# Patient Record
Sex: Female | Born: 1975 | Race: White | Hispanic: No | Marital: Single | Smoking: Former smoker
Health system: Southern US, Community
[De-identification: ages and names within clinical notes are randomized; demographics above are authoritative.]

## PROBLEM LIST (undated history)

## (undated) DIAGNOSIS — R51 Headache: Secondary | ICD-10-CM

## (undated) DIAGNOSIS — E039 Hypothyroidism, unspecified: Secondary | ICD-10-CM

## (undated) DIAGNOSIS — F329 Major depressive disorder, single episode, unspecified: Secondary | ICD-10-CM

## (undated) DIAGNOSIS — F32A Depression, unspecified: Secondary | ICD-10-CM

## (undated) DIAGNOSIS — R519 Headache, unspecified: Secondary | ICD-10-CM

## (undated) DIAGNOSIS — F419 Anxiety disorder, unspecified: Secondary | ICD-10-CM

## (undated) HISTORY — PX: TOOTH EXTRACTION: SUR596

---

## 2015-08-17 ENCOUNTER — Other Ambulatory Visit (HOSPITAL_COMMUNITY)
Admission: RE | Admit: 2015-08-17 | Discharge: 2015-08-17 | Disposition: A | Discharge status: Home / Self Care | Payer: BLUE CROSS/BLUE SHIELD | Source: Ambulatory Visit | Attending: Family Medicine | Admitting: Family Medicine

## 2015-08-17 DIAGNOSIS — Z1151 Encounter for screening for human papillomavirus (HPV): Secondary | ICD-10-CM | POA: Diagnosis present

## 2015-08-17 DIAGNOSIS — Z01419 Encounter for gynecological examination (general) (routine) without abnormal findings: Secondary | ICD-10-CM | POA: Diagnosis present

## 2016-05-17 ENCOUNTER — Other Ambulatory Visit (HOSPITAL_COMMUNITY): Payer: Self-pay | Admitting: General Surgery

## 2016-05-26 ENCOUNTER — Ambulatory Visit (HOSPITAL_COMMUNITY): Payer: BLUE CROSS/BLUE SHIELD

## 2016-05-30 ENCOUNTER — Encounter: Payer: BLUE CROSS/BLUE SHIELD | Attending: General Surgery | Admitting: Dietician

## 2016-05-30 DIAGNOSIS — Z6841 Body Mass Index (BMI) 40.0 and over, adult: Secondary | ICD-10-CM | POA: Insufficient documentation

## 2016-05-30 NOTE — Patient Instructions (Signed)
Follow Pre-Op Goals Try Protein Shakes Call [facility] at [phone] when surgery is scheduled to enroll in Pre-Op Class  Things to remember:  Please always be honest with us. We want to support you!  If you have any questions or concerns in between appointments, please call or email Liz, Leslie, or Laurie.  The diet after surgery will be high protein and low in carbohydrate.  Vitamins and calcium need to be taken for the rest of your life.  Feel free to include support people in any classes or appointments.   Supplement recommendations:  Before Surgery   1 Complete Multivitamin with Iron  3000 IU Vitamin D3  After Surgery   2 Chewable Multivitamins  **Best Choice - Bariatric Advantage Advanced Multi EA      3 Chewable Calcium (500 mg each, total 1200-1500 mg per day)  **Best Choice - Celebrate, Bariatric Advantage, or Wellesse  Other Options:    2 Flinstones Complete + up to 100 mg Thiamin + 2000-3000 IU Vitamin D3 + 350-500 mcg Vitamin B12 + 30-45 mg Iron (with history of deficiency)  2 Celebrate MultiComplete with 18 mg Iron (this provides 6000 IU of  Vitamin D3)  4 Celebrate Essential Multi 2 in 1 (has calcium) + 18-60 mg separate  iron  Vitamins and Calcium are available at:   [facility]   [address], [place], [place] [address]   [email or web address]  [email or web address]  [email or web address]   

## 2016-05-30 NOTE — Progress Notes (Signed)
  Pre-Op Assessment Visit:  Pre-Operative Sleeve Gastrectomy Surgery  Medical Nutrition Therapy:  Appt start time: 0730   End time:  0820.  Patient was seen on 05/30/2016 for Pre-Operative Nutrition Assessment. Assessment [facility] on 05/30/2016.   Preferred Learning Style:   No preference indicated   Learning Readiness:   Ready  Handouts given during visit include:  Pre-Op Goals Bariatric Surgery Protein Shakes   During the appointment today the following Pre-Op Goals were reviewed with the patient: Maintain or lose weight as instructed by your surgeon Make healthy food choices Begin to limit portion sizes Limited concentrated sugars and fried foods Keep fat/sugar in the single digits per serving on   food labels Practice CHEWING your food  (aim for 30 chews per bite or until applesauce consistency) Practice not drinking 15 minutes before, during, and 30 minutes after each meal/snack Avoid all carbonated beverages  Avoid/limit caffeinated beverages  Avoid all sugar-sweetened beverages Consume 3 meals per day; eat every 3-5 hours Make a list of non-food related activities Aim for 64-100 ounces of FLUID daily  Aim for at least 60-80 grams of PROTEIN daily Look for a liquid protein source that contain ?15 g protein and ?5 g carbohydrate  (ex: shakes, drinks, shots)  Patient-Centered Goals: Would like to be able to exercise, feel better about herself, change eating habits 10 confidence/10 importance scale 1-10  Demonstrated degree of understanding via:  Teach Back  Teaching Method Utilized:  Visual Auditory Hands on  Barriers to learning/adherence to lifestyle change: none  [facility] to [facility] the [facility] to enroll in Pre-Op and Post-Op Nutrition Education when surgery date is scheduled.

## 2016-06-28 ENCOUNTER — Encounter: Payer: BLUE CROSS/BLUE SHIELD | Attending: General Surgery | Admitting: Dietician

## 2016-06-28 DIAGNOSIS — Z6841 Body Mass Index (BMI) 40.0 and over, adult: Secondary | ICD-10-CM | POA: Diagnosis present

## 2016-06-28 NOTE — Progress Notes (Signed)
  6 Months Supervised Weight Loss Visit:   Pre-Operative sleeve gastrectomy Surgery  Medical Nutrition Therapy:  Appt start time: 0730 end time:  0745.  Primary concerns today: Supervised Weight Loss Visit. Returns with no weight change since last month. Will be having a 4 week break from school next month where she will be working on doing more research for cooking after surgery. Trying to make more foods at home. Interested in plant based protein. Overall hasn't made a lot of change.  Having one 1 cup of regular coffee and then decaf. Drinking a lot of water meals. Tried to not drink while eating and chewing well. Working on Production designer, theatre/television/filmemotional eating.   Trying to do some walking 2 x day at work for 20-30 minutes.  Weight: 26.4 BMI:40.7  Preferred Learning Style:   No preference indicated   Learning Readiness:   Ready   Progress Towards Goal(s):  In progress.  Handouts given during visit include:  Vegetarian Protein   Nutritional Diagnosis:  [place]-3.3 Obesity related to past poor dietary habits and physical inactivity as evidenced by patient attending supervised weight loss for insurance approval of bariatric surgery.    Intervention:  Nutrition counseling provided. Plan: Continue to work on finding healthier recipes. Continue working on chewing well and not drinking during meals. Continue to work on walking most days.   Teaching Method Utilized:  Visual Auditory Hands on  Barriers to learning/adherence to lifestyle change: none  Demonstrated degree of understanding via:  Teach Back   Monitoring/Evaluation:  Dietary intake, exercise, and body weight. Follow up in 1 months for 6 month supervised weight loss visit.

## 2016-06-28 NOTE — Patient Instructions (Addendum)
Continue to work on finding healthier recipes. Continue working on chewing well and not drinking during meals. Continue to work on walking most days.

## 2016-07-26 ENCOUNTER — Encounter: Payer: BLUE CROSS/BLUE SHIELD | Attending: General Surgery | Admitting: Dietician

## 2016-07-26 DIAGNOSIS — Z6841 Body Mass Index (BMI) 40.0 and over, adult: Secondary | ICD-10-CM | POA: Diagnosis present

## 2016-07-26 NOTE — Patient Instructions (Addendum)
Continue to work on finding healthier recipes. Continue working on chewing well and not drinking during meals. Continue to work on walking most days.  Steam tempeh first before cooking. Try marinating it.

## 2016-07-26 NOTE — Progress Notes (Signed)
  6 Months Supervised Weight Loss Visit:   Pre-Operative sleeve gastrectomy Surgery  Medical Nutrition Therapy:  Appt start time: 0735 end time:  0750.  Primary concerns today: Supervised Weight Loss Visit. Returns with a 3 lb weight gain since last month. Having a lot of stress with changes at work. Trying to not eat due to stress. Trying to cook more. On a break from school. Having 1 cup of caffeine per day.   Having protein smoothie for breakfast. Looking for recipes.   Trying to do some walking 1-2 x day at work and a longer walk on weekend.   Weight: 266.5 BMI:41.1  Preferred Learning Style:   No preference indicated   Learning Readiness:   Ready   Progress Towards Goal(s):  In progress.  Handouts given during visit include:  Vegetarian Protein   Nutritional Diagnosis:  [place]-3.3 Obesity related to past poor dietary habits and physical inactivity as evidenced by patient attending supervised weight loss for insurance approval of bariatric surgery.    Intervention:  Nutrition counseling provided. Plan: Continue to work on finding healthier recipes. Continue working on chewing well and not drinking during meals. Continue to work on walking most days.  Steam tempeh first before cooking. Try marinating it.   Teaching Method Utilized:  Visual Auditory Hands on  Barriers to learning/adherence to lifestyle change: none  Demonstrated degree of understanding via:  Teach Back   Monitoring/Evaluation:  Dietary intake, exercise, and body weight. Follow up in 1 months for 6 month supervised weight loss visit.

## 2016-08-24 ENCOUNTER — Encounter: Payer: BLUE CROSS/BLUE SHIELD | Attending: General Surgery | Admitting: Skilled Nursing Facility1

## 2016-08-24 ENCOUNTER — Encounter: Payer: Self-pay | Admitting: Skilled Nursing Facility1

## 2016-08-24 DIAGNOSIS — E6609 Other obesity due to excess calories: Secondary | ICD-10-CM

## 2016-08-24 DIAGNOSIS — Z6841 Body Mass Index (BMI) 40.0 and over, adult: Secondary | ICD-10-CM | POA: Diagnosis present

## 2016-08-24 NOTE — Patient Instructions (Addendum)
-  Keep working on chewing 30 times per bite   -Get back into more movement   -Drink fluid throughout the day (aim for 64 ounces)  -Work on not drinking during meals: 15 minutes before and 30 minutes after

## 2016-08-24 NOTE — Progress Notes (Signed)
  6 Months Supervised Weight Loss Visit:   Pre-Operative sleeve gastrectomy Surgery  Medical Nutrition Therapy:  Appt start time: 0735 end time:  0750.  Primary concerns today: Supervised Weight Loss Visit. Returns with a 5 pound wt loss. Pt states she has started taking levothyroxine. Pt states she is working on becoming a vegetarian.  Pt states she started school back.    Wt: 261.6 pounds BMI: 40.37  Having protein smoothie for breakfast. Looking for recipes.   Trying to do some walking 1-2 x day at work and a longer walk on weekend: pt states she hopes to start back up    Preferred Learning Style:   No preference indicated   Learning Readiness:   Ready   Progress Towards Goal(s):  In progress.  Handouts given during visit include:  Vegetarian Protein   Nutritional Diagnosis:  [place]-3.3 Obesity related to past poor dietary habits and physical inactivity as evidenced by patient attending supervised weight loss for insurance approval of bariatric surgery.    Intervention:  Nutrition counseling provided. Plan: -Keep working on chewing 30 times per bite   -Get back into more movement   -Drink fluid throughout the day (aim for 64 ounces)  -Work on not drinking during meals: 15 minutes before and 30 minutes after  Teaching Method Utilized:  Visual Auditory Hands on  Barriers to learning/adherence to lifestyle change: none  Demonstrated degree of understanding via:  Teach Back   Monitoring/Evaluation:  Dietary intake, exercise, and body weight. Follow up in 1 months for 6 month supervised weight loss visit.

## 2016-09-28 ENCOUNTER — Encounter: Payer: Self-pay | Admitting: Skilled Nursing Facility1

## 2016-09-28 ENCOUNTER — Encounter: Payer: BLUE CROSS/BLUE SHIELD | Attending: General Surgery | Admitting: Skilled Nursing Facility1

## 2016-09-28 DIAGNOSIS — E669 Obesity, unspecified: Secondary | ICD-10-CM

## 2016-09-28 DIAGNOSIS — Z6841 Body Mass Index (BMI) 40.0 and over, adult: Secondary | ICD-10-CM | POA: Insufficient documentation

## 2016-09-28 NOTE — Progress Notes (Signed)
  6 Months Supervised Weight Loss Visit:   Pre-Operative sleeve gastrectomy Surgery  Medical Nutrition Therapy:  Appt start time: 0735 end time:  0750.  Primary concerns today: Supervised Weight Loss Visit. Returns with a 1 pound wt loss. Pt states she is struggling with temperature control, blood pressure dropping and light headedness. Pt states she is working on eating better: got a freezer of morning star patty. Pt states she does not like vegetables and she is working on being a vegetarian.. Pt states she struggles with GERD. Pt states  Next month is her last month of SWL. Pt states she is still working on trying to be more active.   Wt: 259.11.2 pounds BMI: 40.07  Diet recall: Premier protein shake Leftovers or can of soup-split soup or alphabet soup with crackers  Cereal or stir fry or fajita and vegetables   Beverages: water, coffee,   Having protein smoothie for breakfast. Looking for recipes.   Trying to do some walking 1-2 x day at work and a longer walk on weekend: pt states she hopes to start back up    Preferred Learning Style:   No preference indicated   Learning Readiness:   Ready   Progress Towards Goal(s):  In progress.  Handouts given during visit include:  Vegetarian Protein   Nutritional Diagnosis:  [place]-3.3 Obesity related to past poor dietary habits and physical inactivity as evidenced by patient attending supervised weight loss for insurance approval of bariatric surgery.    Intervention:  Nutrition counseling provided. Plan: -Keep working on chewing 30 times per bite   -Get back into more movement   -Drink fluid throughout the day (aim for 64 ounces)  -Work on not drinking during meals: 15 minutes before and 30 minutes after  Teaching Method Utilized:  Visual Auditory Hands on  Barriers to learning/adherence to lifestyle change: none  Demonstrated degree of understanding via:  Teach Back   Monitoring/Evaluation:  Dietary intake,  exercise, and body weight. Follow up in 1 months for 6 month supervised weight loss visit.

## 2016-10-17 ENCOUNTER — Other Ambulatory Visit: Payer: Self-pay

## 2016-10-17 ENCOUNTER — Ambulatory Visit (HOSPITAL_COMMUNITY)
Admission: RE | Admit: 2016-10-17 | Discharge: 2016-10-17 | Disposition: A | Discharge status: Home / Self Care | Payer: BLUE CROSS/BLUE SHIELD | Source: Ambulatory Visit | Attending: General Surgery | Admitting: General Surgery

## 2016-10-17 ENCOUNTER — Encounter (HOSPITAL_COMMUNITY): Payer: Self-pay | Admitting: Radiology

## 2016-10-17 DIAGNOSIS — Z01818 Encounter for other preprocedural examination: Secondary | ICD-10-CM | POA: Diagnosis not present

## 2016-10-26 ENCOUNTER — Encounter: Payer: Self-pay | Admitting: Skilled Nursing Facility1

## 2016-10-26 ENCOUNTER — Encounter: Payer: BLUE CROSS/BLUE SHIELD | Attending: General Surgery | Admitting: Skilled Nursing Facility1

## 2016-10-26 DIAGNOSIS — Z6841 Body Mass Index (BMI) 40.0 and over, adult: Secondary | ICD-10-CM | POA: Diagnosis present

## 2016-10-26 DIAGNOSIS — E669 Obesity, unspecified: Secondary | ICD-10-CM

## 2016-10-26 NOTE — Patient Instructions (Addendum)
  Cassandra AdieMaria Wood [address],  [place] [id number] [place], [place] [id number]  [facility]    Try the soy based lunch meat

## 2016-10-26 NOTE — Progress Notes (Signed)
  6 Months Supervised Weight Loss Visit:   Pre-Operative sleeve gastrectomy Surgery  Medical Nutrition Therapy:  Appt start time: 0735 end time:  0750.  Primary concerns today: Supervised Weight Loss Visit. Returns with a 1 pound wt gain. Pt states she is struggling with temperature control, blood pressure dropping and light headedness. Pt states she is working on eating better: got a freezer of morning star patty. Pt states she does not like vegetables and she is working on being a vegetarian.. Pt states she struggles with GERD. Pt states  Next month is her last month of SWL. Pt states she is still working on trying to be more active.   Pt states she has been craving a lot of meat recently specifically lunch meat.   Wt: 259.11.2 pounds BMI: 40.07  Diet recall: Premier protein shake Leftovers or can of soup-split soup or alphabet soup with crackers  Cereal or stir fry or fajita and vegetables   Beverages: water, coffee,   Having protein smoothie for breakfast. Looking for recipes.   Trying to do some walking 1-2 x day at work and a longer walk on weekend: pt states she hopes to start back up    Preferred Learning Style:   No preference indicated   Learning Readiness:   Ready   Progress Towards Goal(s):  In progress.  Handouts given during visit include:  Vegetarian Protein   Nutritional Diagnosis:  [place]-3.3 Obesity related to past poor dietary habits and physical inactivity as evidenced by patient attending supervised weight loss for insurance approval of bariatric surgery.    Intervention:  Nutrition counseling provided. Plan: -Try soy basedlunch meat   -Keep working on chewing 30 times per bite   -Get back into more movement   -Drink fluid throughout the day (aim for 64 ounces)  -Work on not drinking during meals: 15 minutes before and 30 minutes after  [facility]  Teaching Method Utilized:  Visual Auditory Hands on  Barriers to  learning/adherence to lifestyle change: none  Demonstrated degree of understanding via:  Teach Back   Monitoring/Evaluation:  Dietary intake, exercise, and body weight. Follow up in 1 months for 6 month supervised weight loss visit.

## 2016-11-06 ENCOUNTER — Encounter: Payer: BLUE CROSS/BLUE SHIELD | Admitting: Skilled Nursing Facility1

## 2016-11-06 DIAGNOSIS — E669 Obesity, unspecified: Secondary | ICD-10-CM

## 2016-11-09 ENCOUNTER — Encounter: Payer: Self-pay | Admitting: Skilled Nursing Facility1

## 2016-11-09 NOTE — Progress Notes (Signed)
  Pre-Operative Nutrition Class:  Appt start time: 6381   End time:  1830.  Patient was seen on 11/07/2016 for Pre-Operative Bariatric Surgery Education at [facility].   Surgery date:  Surgery type: Sleeve Gastrectomy  Start weight at [facility]: 263.5 Weight today: 264.1  TANITA  BODY COMP RESULTS     BMI (kg/m^2)    Fat Mass (lbs)    Fat Free Mass (lbs)    Total Body Water (lbs)    Samples given per MNT protocol. Patient educated on appropriate usage: Opurity Multivitamin Lot # [id number] Exp:10/31/2017  Celebrate Vitamins Calcium Citrate Lot # [id number] Exp: 11/2017  Premeir Protein  Lot #[id number] Exp: 03/nov/2018  The following the learning objectives were met by the patient during this course:  Identify Pre-Op Dietary Goals and will begin 2 weeks pre-operatively  Identify appropriate sources of fluids and proteins   State protein recommendations and appropriate sources pre and post-operatively  Identify Post-Operative Dietary Goals and will follow for 2 weeks post-operatively  Identify appropriate multivitamin and calcium sources  Describe the need for physical activity post-operatively and will follow MD recommendations  State when to call healthcare provider regarding medication questions or post-operative complications  Handouts given during class include:  Pre-Op Bariatric Surgery Diet Handout  Protein Shake Handout  Post-Op Bariatric Surgery Nutrition Handout  [facility]ELT Program Information Flyer  Support Group Information Flyer  [facility] Bariatric Supplements Price List  Follow-Up Plan: Patient will follow-up at [facility] 2 weeks post operatively for diet advancement per MD.

## 2016-11-09 NOTE — Patient Instructions (Addendum)
Valeta Harmslizabeth Mcdonnell  11/09/2016   Your procedure is scheduled on: 11/20/2016    [facility]  [facility] to 3rd floor to  Short Stay Center at   12 noon   Call this number if you have problems the morning of surgery [phone]   Remember: ONLY 1 PERSON MAY GO WITH YOU TO SHORT STAY TO GET  READY MORNING OF YOUR SURGERY.  Do not eat food or drink liquids :After Midnight. May have clear liquids from 12 midnite until 0700am morning of surgery then nothing by mouth.       Take these medicines the morning of surgery with A SIP OF WATER: Inhalers if needed and bring, Lexapro ( Escitalopram), Synthroid, ( Levothyroxine) , Zantac if needed                                 You may not have any metal on your body including hair pins and              piercings  Do not wear jewelry, make-up, lotions, powders or perfumes, deodorant             Do not wear nail polish.  Do not shave  48 hours prior to surgery.                 Do not bring valuables to the hospital. [facility] IS NOT             RESPONSIBLE   FOR VALUABLES.  Contacts, dentures or bridgework may not be worn into surgery.  Leave suitcase in the car. After surgery it may be brought to your room.          CLEAR LIQUID DIET   Foods Allowed                                                                     Foods Excluded  Coffee and tea, regular and decaf                             liquids that you cannot  Plain Jell-O in any flavor                                             see through such as: Fruit ices (not with fruit pulp)                                     milk, soups, orange juice  Iced Popsicles                                    All solid food Carbonated beverages, regular and diet  Cranberry, grape and apple juices Sports drinks like Gatorade Lightly seasoned clear broth or consume(fat free) Sugar, honey syrup  Sample  Menu Breakfast                                Lunch                                     Supper Cranberry juice                    Beef broth                            Chicken broth Jell-O                                     Grape juice                           Apple juice Coffee or tea                        Jell-O                                      Popsicle                                                Coffee or tea                        Coffee or tea  _____________________________________________________________________    Special Instructions: N/A              Please read over the following fact sheets you were given: _____________________________________________________________________             [facility] - Preparing for Surgery Before surgery, you can play an important role.  Because skin is not sterile, your skin needs to be as free of germs as possible.  You can reduce the number of germs on your skin by washing with CHG (chlorahexidine gluconate) soap before surgery.  CHG is an antiseptic cleaner which kills germs and bonds with the skin to continue killing germs even after washing. Please DO NOT use if you have an allergy to CHG or antibacterial soaps.  If your skin becomes reddened/irritated stop using the CHG and inform your nurse when you arrive at Short Stay. Do not shave (including legs and underarms) for at least 48 hours prior to the first CHG shower.  You may shave your face/neck. Please follow these instructions carefully:  1.  Shower with CHG Soap the night before surgery and the  morning of Surgery.  2.  If you choose to wash your hair, wash your hair first as usual with your  normal  shampoo.  3.  After you shampoo, rinse your hair and body thoroughly to remove the  shampoo.  4.  Use CHG as you would any other liquid soap.  You can apply chg directly  to the skin and wash                       Gently with a scrungie or clean  washcloth.  5.  Apply the CHG Soap to your body ONLY FROM THE NECK DOWN.   Do not use on face/ open                           Wound or open sores. Avoid contact with eyes, ears mouth and genitals (private parts).                       Wash face,  Genitals (private parts) with your normal soap.             6.  Wash thoroughly, paying special attention to the area where your surgery  will be performed.  7.  Thoroughly rinse your body with warm water from the neck down.  8.  DO NOT shower/wash with your normal soap after using and rinsing off  the CHG Soap.                9.  Pat yourself dry with a clean towel.            10.  Wear clean pajamas.            11.  Place clean sheets on your bed the night of your first shower and do not  sleep with pets. Day of Surgery : Do not apply any lotions/deodorants the morning of surgery.  Please wear clean clothes to the hospital/surgery center.  FAILURE TO FOLLOW THESE INSTRUCTIONS MAY RESULT IN THE CANCELLATION OF YOUR SURGERY PATIENT SIGNATURE_________________________________  NURSE SIGNATURE__________________________________  ________________________________________________________________________

## 2016-11-14 ENCOUNTER — Encounter (HOSPITAL_COMMUNITY): Payer: Self-pay

## 2016-11-14 ENCOUNTER — Encounter (HOSPITAL_COMMUNITY)
Admission: RE | Admit: 2016-11-14 | Discharge: 2016-11-14 | Disposition: A | Discharge status: Home / Self Care | Payer: BLUE CROSS/BLUE SHIELD | Source: Ambulatory Visit | Attending: General Surgery | Admitting: General Surgery

## 2016-11-14 DIAGNOSIS — Z01812 Encounter for preprocedural laboratory examination: Secondary | ICD-10-CM | POA: Diagnosis present

## 2016-11-14 HISTORY — DX: Headache, unspecified: R51.9

## 2016-11-14 HISTORY — DX: Major depressive disorder, single episode, unspecified: F32.9

## 2016-11-14 HISTORY — DX: Headache: R51

## 2016-11-14 HISTORY — DX: Hypothyroidism, unspecified: E03.9

## 2016-11-14 HISTORY — DX: Anxiety disorder, unspecified: F41.9

## 2016-11-14 HISTORY — DX: Depression, unspecified: F32.A

## 2016-11-14 LAB — BASIC METABOLIC PANEL
ANION GAP: 9 (ref 5–15)
BUN: 13 mg/dL (ref 6–20)
CHLORIDE: 105 mmol/L (ref 101–111)
CO2: 23 mmol/L (ref 22–32)
Calcium: 9 mg/dL (ref 8.9–10.3)
Creatinine, Ser: 0.62 mg/dL (ref 0.44–1.00)
GFR calc Af Amer: >60 mL/min (ref >60)
GFR calc non Af Amer: >60 mL/min (ref >60)
GLUCOSE: 81 mg/dL (ref 65–99)
POTASSIUM: 4 mmol/L (ref 3.5–5.1)
Sodium: 137 mmol/L (ref 135–145)

## 2016-11-14 LAB — CBC
HEMATOCRIT: 37.4 % (ref 36.0–46.0)
HEMOGLOBIN: 12 g/dL (ref 12.0–15.0)
MCH: 23.6 pg — AB (ref 26.0–34.0)
MCHC: 32.1 g/dL (ref 30.0–36.0)
MCV: 73.5 fL — ABNORMAL LOW (ref 78.0–100.0)
Platelets: 350 10*3/uL (ref 150–400)
RBC: 5.09 MIL/uL (ref 3.87–5.11)
RDW: 18.3 % — ABNORMAL HIGH (ref 11.5–15.5)
WBC: 9.1 10*3/uL (ref 4.0–10.5)

## 2016-11-14 LAB — HCG, SERUM, QUALITATIVE: Preg, Serum: NEGATIVE

## 2016-11-16 ENCOUNTER — Ambulatory Visit: Payer: Self-pay | Admitting: General Surgery

## 2016-11-16 NOTE — Progress Notes (Signed)
Surgery on 11/20/2016.  Need orders in epic.  Thank You.

## 2016-11-17 ENCOUNTER — Ambulatory Visit: Payer: Self-pay | Admitting: General Surgery

## 2016-11-20 ENCOUNTER — Encounter (HOSPITAL_COMMUNITY)
Admission: RE | Disposition: A | Discharge status: Home / Self Care | Payer: Self-pay | Source: Ambulatory Visit | Attending: General Surgery

## 2016-11-20 ENCOUNTER — Encounter (HOSPITAL_COMMUNITY): Payer: Self-pay | Admitting: *Deleted

## 2016-11-20 ENCOUNTER — Inpatient Hospital Stay (HOSPITAL_COMMUNITY): Payer: BLUE CROSS/BLUE SHIELD | Admitting: Anesthesiology

## 2016-11-20 ENCOUNTER — Ambulatory Visit: Payer: BLUE CROSS/BLUE SHIELD

## 2016-11-20 ENCOUNTER — Inpatient Hospital Stay (HOSPITAL_COMMUNITY)
Admission: RE | Admit: 2016-11-20 | Discharge: 2016-11-21 | DRG: 621 | Disposition: A | Discharge status: Home / Self Care | Payer: BLUE CROSS/BLUE SHIELD | Source: Ambulatory Visit | Attending: General Surgery | Admitting: General Surgery

## 2016-11-20 DIAGNOSIS — E039 Hypothyroidism, unspecified: Secondary | ICD-10-CM | POA: Diagnosis present

## 2016-11-20 DIAGNOSIS — Z79899 Other long term (current) drug therapy: Secondary | ICD-10-CM | POA: Diagnosis not present

## 2016-11-20 DIAGNOSIS — Z6841 Body Mass Index (BMI) 40.0 and over, adult: Secondary | ICD-10-CM | POA: Diagnosis not present

## 2016-11-20 DIAGNOSIS — F17220 Nicotine dependence, chewing tobacco, uncomplicated: Secondary | ICD-10-CM | POA: Diagnosis present

## 2016-11-20 HISTORY — PX: LAPAROSCOPIC GASTRIC SLEEVE RESECTION: SHX5895

## 2016-11-20 LAB — CBC
HEMATOCRIT: 36.6 % (ref 36.0–46.0)
Hemoglobin: 11.5 g/dL — ABNORMAL LOW (ref 12.0–15.0)
MCH: 23.7 pg — AB (ref 26.0–34.0)
MCHC: 31.4 g/dL (ref 30.0–36.0)
MCV: 75.3 fL — AB (ref 78.0–100.0)
PLATELETS: 316 10*3/uL (ref 150–400)
RBC: 4.86 MIL/uL (ref 3.87–5.11)
RDW: 18.5 % — AB (ref 11.5–15.5)
WBC: 15 10*3/uL — ABNORMAL HIGH (ref 4.0–10.5)

## 2016-11-20 LAB — CREATININE, SERUM
Creatinine, Ser: 0.87 mg/dL (ref 0.44–1.00)
GFR calc Af Amer: >60 mL/min (ref >60)
GFR calc non Af Amer: >60 mL/min (ref >60)

## 2016-11-20 LAB — PREGNANCY, URINE: PREG TEST UR: NEGATIVE

## 2016-11-20 LAB — HEMOGLOBIN AND HEMATOCRIT, BLOOD
HCT: 36.2 % (ref 36.0–46.0)
Hemoglobin: 11.6 g/dL — ABNORMAL LOW (ref 12.0–15.0)

## 2016-11-20 SURGERY — GASTRECTOMY, SLEEVE, LAPAROSCOPIC
Anesthesia: General | Site: Abdomen

## 2016-11-20 MED ORDER — ACETAMINOPHEN 325 MG PO TABS
650.0000 mg | ORAL_TABLET | ORAL | Status: DC | PRN
  Administered 2016-11-21: 650 mg via ORAL
  Filled 2016-11-20: qty 2

## 2016-11-20 MED ORDER — BUPIVACAINE HCL (PF) 0.25 % IJ SOLN
INTRAMUSCULAR | Status: AC
  Filled 2016-11-20: qty 30

## 2016-11-20 MED ORDER — CEFOTETAN DISODIUM-DEXTROSE 2-2.08 GM-% IV SOLR
2.0000 g | INTRAVENOUS | Status: AC
  Administered 2016-11-20: 2 g via INTRAVENOUS
  Filled 2016-11-20: qty 50

## 2016-11-20 MED ORDER — ONDANSETRON HCL 4 MG/2ML IJ SOLN: 4.0000 mg | INTRAMUSCULAR | Status: DC | PRN

## 2016-11-20 MED ORDER — PROPOFOL 10 MG/ML IV BOLUS
INTRAVENOUS | Status: DC | PRN
  Administered 2016-11-20: 170 mg via INTRAVENOUS

## 2016-11-20 MED ORDER — CELECOXIB 200 MG PO CAPS
400.0000 mg | ORAL_CAPSULE | ORAL | Status: AC
  Administered 2016-11-20: 400 mg via ORAL
  Filled 2016-11-20: qty 2

## 2016-11-20 MED ORDER — SODIUM CHLORIDE 0.9 % IV SOLN
INTRAVENOUS | Status: DC
  Administered 2016-11-20: 19:00:00 via INTRAVENOUS
  Administered 2016-11-21: 1000 mL via INTRAVENOUS

## 2016-11-20 MED ORDER — HEPARIN SODIUM (PORCINE) 5000 UNIT/ML IJ SOLN
5000.0000 [IU] | INTRAMUSCULAR | Status: AC
  Administered 2016-11-20: 5000 [IU] via SUBCUTANEOUS
  Filled 2016-11-20: qty 1

## 2016-11-20 MED ORDER — ACETAMINOPHEN 500 MG PO TABS
1000.0000 mg | ORAL_TABLET | ORAL | Status: AC
  Administered 2016-11-20: 1000 mg via ORAL
  Filled 2016-11-20: qty 2

## 2016-11-20 MED ORDER — LEVOTHYROXINE SODIUM 25 MCG PO TABS
25.0000 ug | ORAL_TABLET | Freq: Every day | ORAL | Status: DC
  Administered 2016-11-21: 25 ug via ORAL
  Filled 2016-11-20: qty 1

## 2016-11-20 MED ORDER — SUGAMMADEX SODIUM 500 MG/5ML IV SOLN
INTRAVENOUS | Status: AC
  Filled 2016-11-20: qty 5

## 2016-11-20 MED ORDER — ROCURONIUM BROMIDE 10 MG/ML (PF) SYRINGE
PREFILLED_SYRINGE | INTRAVENOUS | Status: DC | PRN
  Administered 2016-11-20: 50 mg via INTRAVENOUS
  Administered 2016-11-20: 10 mg via INTRAVENOUS

## 2016-11-20 MED ORDER — ONDANSETRON HCL 4 MG/2ML IJ SOLN
INTRAMUSCULAR | Status: AC
  Filled 2016-11-20: qty 2

## 2016-11-20 MED ORDER — LIDOCAINE 2% (20 MG/ML) 5 ML SYRINGE
INTRAMUSCULAR | Status: DC | PRN
  Administered 2016-11-20: 100 mg via INTRAVENOUS

## 2016-11-20 MED ORDER — LIDOCAINE 2% (20 MG/ML) 5 ML SYRINGE
INTRAMUSCULAR | Status: AC
  Filled 2016-11-20: qty 5

## 2016-11-20 MED ORDER — PROMETHAZINE HCL 25 MG/ML IJ SOLN
6.2500 mg | INTRAMUSCULAR | Status: DC | PRN
  Administered 2016-11-20: 12.5 mg via INTRAVENOUS

## 2016-11-20 MED ORDER — ACETAMINOPHEN 160 MG/5ML PO SOLN: 325.0000 mg | ORAL | Status: DC | PRN

## 2016-11-20 MED ORDER — FENTANYL CITRATE (PF) 250 MCG/5ML IJ SOLN
INTRAMUSCULAR | Status: AC
  Filled 2016-11-20: qty 5

## 2016-11-20 MED ORDER — OXYCODONE HCL 5 MG/5ML PO SOLN
5.0000 mg | ORAL | Status: DC | PRN
  Administered 2016-11-21: 5 mg via ORAL
  Filled 2016-11-20: qty 5

## 2016-11-20 MED ORDER — LACTATED RINGERS IV SOLN
INTRAVENOUS | Status: DC
  Administered 2016-11-20: 13:00:00 via INTRAVENOUS

## 2016-11-20 MED ORDER — BUPIVACAINE HCL 0.25 % IJ SOLN
INTRAMUSCULAR | Status: DC | PRN
  Administered 2016-11-20: 30 mL

## 2016-11-20 MED ORDER — PROPOFOL 10 MG/ML IV BOLUS
INTRAVENOUS | Status: AC
  Filled 2016-11-20: qty 20

## 2016-11-20 MED ORDER — LACTATED RINGERS IR SOLN
Status: DC | PRN
  Administered 2016-11-20: 1000 mL

## 2016-11-20 MED ORDER — MORPHINE SULFATE (PF) 2 MG/ML IV SOLN
1.0000 mg | INTRAVENOUS | Status: DC | PRN
  Administered 2016-11-20: 1 mg via INTRAVENOUS
  Filled 2016-11-20: qty 1

## 2016-11-20 MED ORDER — ROCURONIUM BROMIDE 50 MG/5ML IV SOSY
PREFILLED_SYRINGE | INTRAVENOUS | Status: AC
  Filled 2016-11-20: qty 5

## 2016-11-20 MED ORDER — PROMETHAZINE HCL 25 MG/ML IJ SOLN
INTRAMUSCULAR | Status: AC
  Filled 2016-11-20: qty 1

## 2016-11-20 MED ORDER — FENTANYL CITRATE (PF) 100 MCG/2ML IJ SOLN
INTRAMUSCULAR | Status: DC | PRN
  Administered 2016-11-20 (×2): 50 ug via INTRAVENOUS
  Administered 2016-11-20: 150 ug via INTRAVENOUS

## 2016-11-20 MED ORDER — DEXAMETHASONE SODIUM PHOSPHATE 10 MG/ML IJ SOLN
INTRAMUSCULAR | Status: DC | PRN
  Administered 2016-11-20: 10 mg via INTRAVENOUS

## 2016-11-20 MED ORDER — APREPITANT 40 MG PO CAPS
40.0000 mg | ORAL_CAPSULE | ORAL | Status: AC
  Administered 2016-11-20: 40 mg via ORAL
  Filled 2016-11-20: qty 1

## 2016-11-20 MED ORDER — MIDAZOLAM HCL 2 MG/2ML IJ SOLN
INTRAMUSCULAR | Status: AC
  Filled 2016-11-20: qty 2

## 2016-11-20 MED ORDER — PANTOPRAZOLE SODIUM 40 MG IV SOLR
40.0000 mg | Freq: Every day | INTRAVENOUS | Status: DC
  Administered 2016-11-20: 40 mg via INTRAVENOUS
  Filled 2016-11-20: qty 40

## 2016-11-20 MED ORDER — ENOXAPARIN SODIUM 30 MG/0.3ML ~~LOC~~ SOLN
30.0000 mg | Freq: Two times a day (BID) | SUBCUTANEOUS | Status: DC
  Administered 2016-11-21: 30 mg via SUBCUTANEOUS
  Filled 2016-11-20: qty 0.3

## 2016-11-20 MED ORDER — HYDROMORPHONE HCL 1 MG/ML IJ SOLN
0.2500 mg | INTRAMUSCULAR | Status: DC | PRN
  Administered 2016-11-20 (×3): 0.25 mg via INTRAVENOUS

## 2016-11-20 MED ORDER — BUPIVACAINE LIPOSOME 1.3 % IJ SUSP
20.0000 mL | Freq: Once | INTRAMUSCULAR | Status: AC
  Administered 2016-11-20: 20 mL
  Filled 2016-11-20: qty 20

## 2016-11-20 MED ORDER — HYDROMORPHONE HCL 1 MG/ML IJ SOLN
INTRAMUSCULAR | Status: AC
  Filled 2016-11-20: qty 1

## 2016-11-20 MED ORDER — SUCCINYLCHOLINE CHLORIDE 200 MG/10ML IV SOSY
PREFILLED_SYRINGE | INTRAVENOUS | Status: DC | PRN
Start: 2016-11-20 — End: 2016-11-20
  Administered 2016-11-20: 140 mg via INTRAVENOUS

## 2016-11-20 MED ORDER — MIDAZOLAM HCL 2 MG/2ML IJ SOLN
INTRAMUSCULAR | Status: DC | PRN
  Administered 2016-11-20: 2 mg via INTRAVENOUS

## 2016-11-20 MED ORDER — PREMIER PROTEIN SHAKE: 2.0000 [oz_av] | ORAL | Status: DC

## 2016-11-20 MED ORDER — MELATONIN 5 MG PO CAPS: 5.0000 mg | ORAL_CAPSULE | Freq: Every day | ORAL | Status: DC

## 2016-11-20 MED ORDER — GABAPENTIN 300 MG PO CAPS
300.0000 mg | ORAL_CAPSULE | ORAL | Status: AC
  Administered 2016-11-20: 300 mg via ORAL
  Filled 2016-11-20: qty 1

## 2016-11-20 MED ORDER — ONDANSETRON HCL 4 MG/2ML IJ SOLN
INTRAMUSCULAR | Status: DC | PRN
  Administered 2016-11-20: 4 mg via INTRAVENOUS

## 2016-11-20 MED ORDER — 0.9 % SODIUM CHLORIDE (POUR BTL) OPTIME
TOPICAL | Status: DC | PRN
  Administered 2016-11-20: 1000 mL

## 2016-11-20 MED ORDER — DEXAMETHASONE SODIUM PHOSPHATE 10 MG/ML IJ SOLN
INTRAMUSCULAR | Status: AC
  Filled 2016-11-20: qty 1

## 2016-11-20 MED ORDER — SUGAMMADEX SODIUM 200 MG/2ML IV SOLN
INTRAVENOUS | Status: DC | PRN
  Administered 2016-11-20: 300 mg via INTRAVENOUS

## 2016-11-20 SURGICAL SUPPLY — 58 items
APPLIER CLIP 5 13 M/L LIGAMAX5 (MISCELLANEOUS)
APPLIER CLIP ROT 10 11.4 M/L (STAPLE)
APPLIER CLIP ROT 13.4 12 LRG (CLIP)
BAG LAPAROSCOPIC 12 15 PORT 16 (BASKET) ×1 IMPLANT
BAG RETRIEVAL 12/15 (BASKET) ×2
BANDAGE ADH SHEER 1  50/CT (GAUZE/BANDAGES/DRESSINGS) ×10 IMPLANT
BENZOIN TINCTURE PRP APPL 2/3 (GAUZE/BANDAGES/DRESSINGS) ×2 IMPLANT
BLADE SURG SZ11 CARB STEEL (BLADE) ×2 IMPLANT
CABLE HIGH FREQUENCY MONO STRZ (ELECTRODE) ×2 IMPLANT
CHLORAPREP W/TINT 26ML (MISCELLANEOUS) ×2 IMPLANT
CLIP APPLIE 5 13 M/L LIGAMAX5 (MISCELLANEOUS) IMPLANT
CLIP APPLIE ROT 10 11.4 M/L (STAPLE) IMPLANT
CLIP APPLIE ROT 13.4 12 LRG (CLIP) IMPLANT
CLOSURE STERI-STRIP 1/4X4 (GAUZE/BANDAGES/DRESSINGS) ×2 IMPLANT
COVER SURGICAL LIGHT HANDLE (MISCELLANEOUS) ×2 IMPLANT
DRAIN CHANNEL 19F RND (DRAIN) IMPLANT
ELECT REM PT RETURN 15FT ADLT (MISCELLANEOUS) ×2 IMPLANT
EVACUATOR SILICONE 100CC (DRAIN) IMPLANT
GAUZE SPONGE 4X4 12PLY STRL (GAUZE/BANDAGES/DRESSINGS) IMPLANT
GLOVE BIOGEL PI IND STRL 7.0 (GLOVE) ×1 IMPLANT
GLOVE BIOGEL PI INDICATOR 7.0 (GLOVE) ×1
GLOVE SURG SS PI 7.0 STRL IVOR (GLOVE) ×2 IMPLANT
GOWN STRL REUS W/TWL LRG LVL3 (GOWN DISPOSABLE) ×2 IMPLANT
GOWN STRL REUS W/TWL XL LVL3 (GOWN DISPOSABLE) ×6 IMPLANT
GRASPER SUT TROCAR 14GX15 (MISCELLANEOUS) ×2 IMPLANT
HANDLE STAPLE EGIA 4 XL (STAPLE) ×2 IMPLANT
HOVERMATT SINGLE USE (MISCELLANEOUS) ×2 IMPLANT
KIT BASIN OR (CUSTOM PROCEDURE TRAY) ×2 IMPLANT
MARKER SKIN DUAL TIP RULER LAB (MISCELLANEOUS) ×2 IMPLANT
NEEDLE SPNL 22GX3.5 QUINCKE BK (NEEDLE) ×2 IMPLANT
PACK CARDIOVASCULAR III (CUSTOM PROCEDURE TRAY) ×2 IMPLANT
RELOAD EGIA 45 MED/THCK PURPLE (STAPLE) IMPLANT
RELOAD TRI 45 ART MED THCK BLK (STAPLE) IMPLANT
RELOAD TRI 45 ART MED THCK PUR (STAPLE) IMPLANT
RELOAD TRI 60 ART MED THCK BLK (STAPLE) ×4 IMPLANT
RELOAD TRI 60 ART MED THCK PUR (STAPLE) ×8 IMPLANT
SCISSORS LAP 5X45 EPIX DISP (ENDOMECHANICALS) ×2 IMPLANT
SET IRRIG TUBING LAPAROSCOPIC (IRRIGATION / IRRIGATOR) ×2 IMPLANT
SHEARS HARMONIC ACE PLUS 45CM (MISCELLANEOUS) ×2 IMPLANT
SLEEVE GASTRECTOMY 40FR VISIGI (MISCELLANEOUS) ×2 IMPLANT
SLEEVE XCEL OPT CAN 5 100 (ENDOMECHANICALS) ×4 IMPLANT
SOLUTION ANTI FOG 6CC (MISCELLANEOUS) ×2 IMPLANT
SPONGE LAP 18X18 X RAY DECT (DISPOSABLE) ×2 IMPLANT
STRIP CLOSURE SKIN 1/2X4 (GAUZE/BANDAGES/DRESSINGS) ×2 IMPLANT
SUT ETHIBOND 0 36 GRN (SUTURE) IMPLANT
SUT ETHILON 2 0 PS N (SUTURE) IMPLANT
SUT MNCRL AB 4-0 PS2 18 (SUTURE) ×2 IMPLANT
SUT SILK 0 SH 30 (SUTURE) IMPLANT
SUT VICRYL 0 TIES 12 18 (SUTURE) ×2 IMPLANT
SYR 20CC LL (SYRINGE) ×2 IMPLANT
SYR 50ML LL SCALE MARK (SYRINGE) ×2 IMPLANT
TOWEL OR 17X26 10 PK STRL BLUE (TOWEL DISPOSABLE) ×2 IMPLANT
TOWEL OR NON WOVEN STRL DISP B (DISPOSABLE) ×2 IMPLANT
TROCAR BLADELESS 15MM (ENDOMECHANICALS) ×2 IMPLANT
TROCAR BLADELESS OPT 5 100 (ENDOMECHANICALS) ×2 IMPLANT
TUBING CONNECTING 10 (TUBING) ×2 IMPLANT
TUBING ENDO SMARTCAP (MISCELLANEOUS) ×2 IMPLANT
TUBING INSUF HEATED (TUBING) ×2 IMPLANT

## 2016-11-20 NOTE — Op Note (Signed)
Preop Diagnosis: Obesity Class III  Postop Diagnosis: same  Procedure performed: laparoscopic Sleeve Gastrectomy  Assitant: Ovidio Kin  Indications:  The patient is a 40 y.o. year-old morbidly obese female who has been followed in the Bariatric Clinic as an outpatient. This patient was diagnosed with morbid obesity with a BMI of Body mass index is 42.37 kg/m. and significant co-morbidities including none.  The patient was counseled extensively in the Bariatric Outpatient Clinic and after a thorough explanation of the risks and benefits of surgery (including death from complications, bowel leak, infection such as peritonitis and/or sepsis, internal hernia, bleeding, need for blood transfusion, bowel obstruction, organ failure, pulmonary embolus, deep venous thrombosis, wound infection, incisional hernia, skin breakdown, and others entailed on the consent form) and after a compliant diet and exercise program, the patient was scheduled for an elective laparoscopic sleeve gastrectomy.  Description of Operation:  Following informed consent, the patient was taken to the operating room and placed on the operating table in the supine position.  She had previously received prophylactic antibiotics and subcutaneous heparin for DVT prophylaxis in the pre-op holding area.  After induction of general endotracheal anesthesia by the anesthesiologist, the patient underwent placement of sequential compression devices, Foley catheter and an oro-gastric tube.  A timeout was confirmed by the surgery and anesthesia teams.  The patient was adequately padded at all pressure points and placed on a footboard to prevent slippage from the OR table during extremes of position during surgery.  She underwent a routine sterile prep and drape of her entire abdomen.    Next, A transverse incision was made under the left subcostal area and a 5mm optical viewing trocar was introduced into the peritoneal cavity. Pneumoperitoneum was  applied with a high flow and low pressure. A laparoscope was inserted to confirm placement. A extraperitoneal block was then placed at the lateral abdominal wall using exparel diluted with marcaine. 5 additional trocars were placed: 1 5mm trocar to the left of the midline. 1 additional 5mm trocar in the left lateral area, 1 12mm trocar in the right mid abdomen, and 1 5mm trocar in the right subcostal area.  The fat pad at the GE junction was incised and the gastrodiaphragmatic ligament was divided using the Harmonic scalpel. Next, a hole was created through the lesser omentum along the greater curve of the stomach to enter the lesser sac. The vessels along the greater omentum were  Then ligated and divided using the Harmonic scalpel moving towards the spleen and then short gastric vessels were ligated and divided in the same fashion to fully mobilize the fundus. The left crus was identified to ensure completion of the dissection. Next the antrum was measured and dissection continued inferiorly along the greater curve towards the pylorus and stopped 6cm from the pylorus.   A 40Fr ViSiGi dilator was placed into the esophgaus and along the lesser curve of the stomach and placed on suction. A 60mm 4-33mm tristapler was used to begin the resection along the antrum being sure to stay well away from the angularis by angling the jaws of the stapler towards the greater curve. An additional 60mm 4-51mm tristapler was used to continue the dissection. Then multiple 60mm 3-25mm tristapler loads were used to complete the resection staying along the ViSiGi and ensuring the fundus was not retained by appropriately retracting it lateral. Air was inserted through the ViSiGi to perform a leak test showing no bubbles and a neutral lie of the stomach.  The assistant then went  and performed an upper endoscopy and leak test. No bubbles were seen and the sleeve and antrum distended appropriately. The specimen was then placed in an  endocatch bag and removed by the 55mm port. The fascia of the 110mm port was closed with a 0 vicryl by suture passer. Hemostasis was ensured. Pneumoperitoneum was evacuated, all ports were removed and all incisions closed with 4-0 monocryl suture in subcuticular fashion. Glue was put in place for dressing. The patient awoke from anesthesia and was brought to pacu in stable condition. All counts were correct.  Estimated blood loss: <64ml  Specimens:  Sleeve gastrectomy  Local Anesthesia: 50 ml Exparel:0.25% Marcaine mix  Post-Op Plan:       Pain Management: PO, prn      Antibiotics: Prophylactic      Anticoagulation: Prophylactic, Starting now      Post Op Studies/Consults: Not applicable      Intended Discharge: within 48h      Intended Outpatient Follow-Up: Two Week      Intended Outpatient Studies: Not Applicable      Other: Not Applicable   Cassandra Wood Cassandra Wood

## 2016-11-20 NOTE — Progress Notes (Signed)
PHARMACIST - PHYSICIAN ORDER COMMUNICATION  CONCERNING: P&T Medication Policy on Herbal Medications  DESCRIPTION:  This patient's order for:  Melatonin 5mg   has been noted.  This product(s) is classified as an "herbal" or natural product. Due to a lack of definitive safety studies or FDA approval, nonstandard manufacturing practices, plus the potential risk of unknown drug-drug interactions while on inpatient medications, the Pharmacy and Therapeutics Committee does not permit the use of "herbal" or natural products of this type within [facility].   ACTION TAKEN: The pharmacy department is unable to verify this order at this time and your patient has been informed of this safety policy. Please reevaluate patient's clinical condition at discharge and address if the herbal or natural product(s) should be resumed at that time.   

## 2016-11-20 NOTE — Discharge Instructions (Signed)

## 2016-11-20 NOTE — Transfer of Care (Signed)
Immediate Anesthesia Transfer of Care Note  Patient: Cassandra Wood  Procedure(s) Performed: Procedure(s): LAPAROSCOPIC GASTRIC SLEEVE RESECTION WITH UPPER ENDOSCOPY (N/A)  Patient Location: PACU  Anesthesia Type:General  Level of Consciousness: awake and oriented  Airway & Oxygen Therapy: Patient Spontanous Breathing and Patient connected to face mask oxygen  Post-op Assessment: Report given to RN and Post -op Vital signs reviewed and stable  Post vital signs: Reviewed and stable  Last Vitals:  Vitals:   11/20/16 1151  BP: 110/67  Pulse: 70  Resp: 18  Temp: 36.9 C    Last Pain:  Vitals:   11/20/16 1312  TempSrc:   PainSc: 0-No pain      Patients Stated Pain Goal: 4 (11/20/16 1312)  Complications: No apparent anesthesia complications

## 2016-11-20 NOTE — Anesthesia Postprocedure Evaluation (Addendum)
Anesthesia Post Note  Patient: Cassandra Wood  Procedure(s) Performed: Procedure(s) (LRB): LAPAROSCOPIC GASTRIC SLEEVE RESECTION WITH UPPER ENDOSCOPY (N/A)  Patient location during evaluation: PACU Anesthesia Type: General Level of consciousness: awake and alert Pain management: pain level controlled Vital Signs Assessment: post-procedure vital signs reviewed and stable Respiratory status: spontaneous breathing, nonlabored ventilation, respiratory function stable and patient connected to nasal cannula oxygen Cardiovascular status: blood pressure returned to baseline and stable Postop Assessment: no signs of nausea or vomiting Anesthetic complications: no       Last Vitals:  Vitals:   11/20/16 1630 11/20/16 1643  BP: 112/73 126/80  Pulse: 62 (!) 52  Resp: 12   Temp:  36.6 C    Last Pain:  Vitals:   11/20/16 1615  TempSrc:   PainSc: 6                  Khalin Royce S

## 2016-11-20 NOTE — Op Note (Signed)
Name:  Cassandra Wood MRN: [id number] Date of Surgery: 11/20/2016  Preop Diagnosis:  Morbid Obesity  Postop Diagnosis:  Morbid Obesity (Weight - 254, BMI - 42.3), S/P Gastric Sleeve resection  Procedure:  Upper endoscopy  (Intraoperative)  Surgeon:  Ovidio Kin, M.D.  Anesthesia:  GET  Indications for procedure: [name] is a 41 y.o. female whose primary care physician is WEBB, CAROL D, MD and has completed a gastric sleeve resection today for weight loss by Dr. Sheliah Hatch.  I am doing an intraoperative upper endoscopy to evaluate the gastric pouch after the sleeve gastrectomy.  Operative Note: The patient is under general anesthesia.  Dr. Sheliah Hatch is laparoscoping the patient while I do an upper endoscopy to evaluate the stomach pouch.  With the patient intubated, I passed the Pentax upper endoscope without difficulty down the esophagus.  The esophagus was unremarkable.  The esophago-gastric junction was at 38 cm.    The mucosa of the stomach looked viable and the staple line was intact without bleeding.  I advanced the scope to the pylorus, but did not go through it.  While I insufflated the stomach pouch with air, Dr. Sheliah Hatch  flooded the upper abdomen with saline to put the gastric pouch under saline.  There was no bubbling or evidence of a leak.  There was no evidence of narrowing of the pouch and the gastric sleeve looked tubular.  The scope was then withdrawn.  The esophagus was unremarkable and the patient tolerated the endoscopy without difficulty.  Ovidio Kin, MD, [facility] Pager: [phone] Office phone:  [phone]

## 2016-11-20 NOTE — H&P (Signed)
Cassandra Wood is an 41 y.o. female.   Chief Complaint: obesity HPI: 41 yo female with morbid obesity. She has completed all requirements and is ready to proceed with sleeve gastrectomy  Past Medical History:  Diagnosis Date  . Anxiety    Social  . Depression   . Headache    Ocassional  . Hypothyroidism     Past Surgical History:  Procedure Laterality Date  . TOOTH EXTRACTION     4 wisdom tooth extracted in 2006    History reviewed. No pertinent family history. Social History:  reports that she quit smoking about 8 years ago. She has quit using smokeless tobacco. Her smokeless tobacco use included Chew. She reports that she uses drugs, including Marijuana. She reports that she does not drink alcohol.  Allergies: No Known Allergies  Medications Prior to Admission  Medication Sig Dispense Refill  . acetaminophen (TYLENOL) 500 MG tablet Take 1,000 mg by mouth every 6 (six) hours as needed for headache.    . albuterol (PROVENTIL HFA;VENTOLIN HFA) 108 (90 Base) MCG/ACT inhaler Inhale 2 puffs into the lungs every 6 (six) hours as needed for wheezing or shortness of breath.    . escitalopram (LEXAPRO) 20 MG tablet Take 30 mg by mouth daily. Takes 1.5 tablet daily    . ibuprofen (ADVIL,MOTRIN) 200 MG tablet Take 400 mg by mouth every 6 (six) hours as needed for mild pain.    Marland Kitchen levothyroxine (SYNTHROID, LEVOTHROID) 25 MCG tablet Take 25 mcg by mouth daily before breakfast.     . Melatonin 5 MG CAPS Take 5 mg by mouth at bedtime.    . Multiple Vitamin (MULTIVITAMIN WITH MINERALS) TABS tablet Take 1 tablet by mouth daily.    Marland Kitchen MYDAYIS 37.5 MG CP24 Take 37.5 mg by mouth daily.     . ranitidine (ZANTAC) 150 MG tablet Take 150 mg by mouth daily as needed for heartburn. Depends on heartburn if takes the 75 mg or 150 mg    . ranitidine (ZANTAC) 75 MG tablet Take 75 mg by mouth daily as needed for heartburn. Depends on heartburn if takes 75 mg or 150 mg      No results found for this or any  previous visit (from the past 48 hour(s)). No results found.  Review of Systems  Constitutional: Negative for chills and fever.  HENT: Negative for hearing loss.   Eyes: Negative for blurred vision and double vision.  Respiratory: Negative for cough and hemoptysis.   Cardiovascular: Negative for chest pain and palpitations.  Gastrointestinal: Negative for abdominal pain, nausea and vomiting.  Genitourinary: Negative for dysuria and urgency.  Musculoskeletal: Negative for myalgias and neck pain.  Skin: Negative for itching and rash.  Neurological: Negative for dizziness, tingling and headaches.  Endo/Heme/Allergies: Does not bruise/bleed easily.  Psychiatric/Behavioral: Negative for depression and suicidal ideas.    Blood pressure 110/67, pulse 70, temperature 98.5 F (36.9 C), temperature source Oral, resp. rate 18, height  (1.651 m), weight 115.5 kg (254 lb 9.6 oz), last menstrual period 11/15/2016, SpO2 100 %. Physical Exam  Vitals reviewed. Constitutional: She is oriented to person, place, and time. She appears well-developed and well-nourished.  HENT:  Head: Normocephalic and atraumatic.  Eyes: Conjunctivae and EOM are normal. Pupils are equal, round, and reactive to light.  Neck: Normal range of motion. Neck supple.  Cardiovascular: Normal rate and regular rhythm.   Respiratory: Effort normal and breath sounds normal.  GI: Soft. Bowel sounds are normal. She exhibits no distension.  There is no tenderness.  Musculoskeletal: Normal range of motion.  Neurological: She is alert and oriented to person, place, and time.  Skin: Skin is warm and dry.  Psychiatric: She has a normal mood and affect. Her behavior is normal.     Assessment/Plan 41 yo female with morbid obesity -lap sleeve gastrectomy  [name], MD 11/20/2016, 1:28 PM

## 2016-11-20 NOTE — Anesthesia Preprocedure Evaluation (Signed)
Anesthesia Evaluation  Patient identified by MRN, date of birth, ID band Patient awake    Reviewed: Allergy & Precautions, NPO status , Patient's Chart, lab work & pertinent test results  Airway Mallampati: II  TM Distance: <3 FB Neck ROM: Full    Dental no notable dental hx.    Pulmonary neg pulmonary ROS, former smoker,    Pulmonary exam normal breath sounds clear to auscultation       Cardiovascular negative cardio ROS Normal cardiovascular exam Rhythm:Regular Rate:Normal     Neuro/Psych negative neurological ROS  negative psych ROS   GI/Hepatic negative GI ROS, Neg liver ROS,   Endo/Other  Hypothyroidism Morbid obesity  Renal/GU negative Renal ROS  negative genitourinary   Musculoskeletal negative musculoskeletal ROS (+)   Abdominal   Peds negative pediatric ROS (+)  Hematology negative hematology ROS (+)   Anesthesia Other Findings   Reproductive/Obstetrics negative OB ROS                             Anesthesia Physical Anesthesia Plan  ASA: II  Anesthesia Plan: General   Post-op Pain Management:    Induction: Intravenous  Airway Management Planned: Oral ETT  Additional Equipment:   Intra-op Plan:   Post-operative Plan: Extubation in OR  Informed Consent: I have reviewed the patients History and Physical, chart, labs and discussed the procedure including the risks, benefits and alternatives for the proposed anesthesia with the patient or authorized representative who has indicated his/her understanding and acceptance.   Dental advisory given  Plan Discussed with: CRNA and Surgeon  Anesthesia Plan Comments:         Anesthesia Quick Evaluation

## 2016-11-20 NOTE — Anesthesia Procedure Notes (Signed)
Procedure Name: Intubation Date/Time: 11/20/2016 2:03 PM Performed by: Leroy Libman L Patient Re-evaluated:Patient Re-evaluated prior to inductionOxygen Delivery Method: Circle system utilized Preoxygenation: Pre-oxygenation with 100% oxygen Intubation Type: IV induction Ventilation: Mask ventilation without difficulty and Oral airway inserted - appropriate to patient size Laryngoscope Size: Miller and 3 Grade View: Grade I Tube type: Oral Tube size: 7.5 mm Number of attempts: 1 Airway Equipment and Method: Stylet Placement Confirmation: ETT inserted through vocal cords under direct vision,  positive ETCO2 and breath sounds checked- equal and bilateral Secured at: 22 cm Tube secured with: Tape Dental Injury: Teeth and Oropharynx as per pre-operative assessment

## 2016-11-21 ENCOUNTER — Encounter (HOSPITAL_COMMUNITY): Payer: Self-pay | Admitting: General Surgery

## 2016-11-21 LAB — COMPREHENSIVE METABOLIC PANEL
ALBUMIN: 3.5 g/dL (ref 3.5–5.0)
ALT: 19 U/L (ref 14–54)
AST: 21 U/L (ref 15–41)
Alkaline Phosphatase: 68 U/L (ref 38–126)
Anion gap: 9 (ref 5–15)
BUN: 8 mg/dL (ref 6–20)
CHLORIDE: 107 mmol/L (ref 101–111)
CO2: 23 mmol/L (ref 22–32)
Calcium: 8.4 mg/dL — ABNORMAL LOW (ref 8.9–10.3)
Creatinine, Ser: 0.6 mg/dL (ref 0.44–1.00)
GFR calc Af Amer: >60 mL/min (ref >60)
GFR calc non Af Amer: >60 mL/min (ref >60)
Glucose, Bld: 131 mg/dL — ABNORMAL HIGH (ref 65–99)
POTASSIUM: 3.7 mmol/L (ref 3.5–5.1)
SODIUM: 139 mmol/L (ref 135–145)
Total Bilirubin: 0.7 mg/dL (ref 0.3–1.2)
Total Protein: 6.8 g/dL (ref 6.5–8.1)

## 2016-11-21 LAB — CBC WITH DIFFERENTIAL/PLATELET
BASOS ABS: 0 10*3/uL (ref 0.0–0.1)
BASOS PCT: 0 %
EOS ABS: 0 10*3/uL (ref 0.0–0.7)
Eosinophils Relative: 0 %
HEMATOCRIT: 33 % — AB (ref 36.0–46.0)
Hemoglobin: 10.5 g/dL — ABNORMAL LOW (ref 12.0–15.0)
Lymphocytes Relative: 8 %
Lymphs Abs: 0.9 10*3/uL (ref 0.7–4.0)
MCH: 23.8 pg — ABNORMAL LOW (ref 26.0–34.0)
MCHC: 31.8 g/dL (ref 30.0–36.0)
MCV: 74.7 fL — ABNORMAL LOW (ref 78.0–100.0)
MONO ABS: 0.4 10*3/uL (ref 0.1–1.0)
Monocytes Relative: 3 %
NEUTROS ABS: 9.7 10*3/uL — AB (ref 1.7–7.7)
NEUTROS PCT: 89 %
Platelets: 302 10*3/uL (ref 150–400)
RBC: 4.42 MIL/uL (ref 3.87–5.11)
RDW: 18.5 % — AB (ref 11.5–15.5)
WBC: 11 10*3/uL — ABNORMAL HIGH (ref 4.0–10.5)

## 2016-11-21 MED ORDER — ALBUTEROL SULFATE (2.5 MG/3ML) 0.083% IN NEBU
2.5000 mg | INHALATION_SOLUTION | Freq: Four times a day (QID) | RESPIRATORY_TRACT | Status: DC | PRN
  Administered 2016-11-21: 2.5 mg via RESPIRATORY_TRACT
  Filled 2016-11-21: qty 3

## 2016-11-21 MED ORDER — ALBUTEROL SULFATE HFA 108 (90 BASE) MCG/ACT IN AERS: 2.0000 | INHALATION_SPRAY | Freq: Four times a day (QID) | RESPIRATORY_TRACT | Status: DC | PRN

## 2016-11-21 MED ORDER — SIMETHICONE 80 MG PO CHEW
80.0000 mg | CHEWABLE_TABLET | Freq: Four times a day (QID) | ORAL | Status: DC | PRN
  Administered 2016-11-21: 80 mg via ORAL
  Filled 2016-11-21: qty 1

## 2016-11-21 NOTE — Progress Notes (Signed)
Patient alert and oriented, pain is controlled. Patient is tolerating fluids, advanced to protein shake today, patient is tolerating well.  Reviewed Gastric sleeve discharge instructions with patient and patient is able to articulate understanding.  Provided information on BELT program, Support Group and [facility] outpatient pharmacy. All questions answered, will continue to monitor.  

## 2016-11-21 NOTE — Progress Notes (Signed)
Patient alert and oriented, Post op day 1.  Provided support and encouragement.  Encouraged pulmonary toilet, ambulation and small sips of liquids.  Protein shake started. All questions answered.  Will continue to monitor.

## 2016-11-21 NOTE — Discharge Summary (Signed)
Physician Discharge Summary  Cassandra Wood NWG:[id number] DOB: 1976/01/18 DOA: 11/20/2016  PCP: [name], MD  Admit date: 11/20/2016 Discharge date: 11/21/2016  Recommendations for Outpatient Follow-up:  1.  (include homehealth, outpatient follow-up instructions, specific recommendations for PCP to follow-up on, etc.)  Follow-up Information    [name], MD. Go on 12/07/2016.   Specialty:  General Surgery Why:  at 930 am Contact information: [address] [place] [address] [phone]        [name], MD Follow up.   Specialty:  General Surgery Contact information: [address] [place] [address] [place] [address] [phone]          Discharge Diagnoses:  Active Problems:   Morbid obesity (HCC)   Surgical Procedure: Laparoscopic Sleeve Gastrectomy, upper endoscopy  Discharge Condition: Good Disposition: Home  Diet recommendation: Postoperative sleeve gastrectomy diet (liquids only)  Filed Weights   11/20/16 1151  Weight: 115.5 kg (254 lb 9.6 oz)     Hospital Course:  The patient was admitted after undergoing laparoscopic sleeve gastrectomy. POD 0 she ambulated well. POD 1 she was started on the water diet protocol and tolerated 400 ml in the first shift. Once meeting the water amount she was advanced to bariatric protein shakes which they tolerated and were discharged home POD 1.  Treatments: surgery: laparoscopic sleeve gastrectomy  Discharge Instructions  Discharge Instructions    Ambulate hourly while awake    Complete by:  As directed    Call MD for:  difficulty breathing, headache or visual disturbances    Complete by:  As directed    Call MD for:  persistant dizziness or light-headedness    Complete by:  As directed    Call MD for:  persistant nausea and vomiting    Complete by:  As directed    Call MD for:  redness, tenderness, or signs of infection (pain, swelling, redness, odor or green/yellow  discharge around incision site)    Complete by:  As directed    Call MD for:  severe uncontrolled pain    Complete by:  As directed    Call MD for:  temperature >101 F    Complete by:  As directed    Diet bariatric full liquid    Complete by:  As directed    Discharge wound care:    Complete by:  As directed    Remove Bandaids tomorrow, ok to shower tomorrow. Steristrips may fall off in 1-3 weeks.   Incentive spirometry    Complete by:  As directed    Perform hourly while awake     Allergies as of 11/21/2016   No Known Allergies     Medication List    TAKE these medications   acetaminophen 500 MG tablet Commonly known as:  TYLENOL Take 1,000 mg by mouth every 6 (six) hours as needed for headache.   albuterol 108 (90 Base) MCG/ACT inhaler Commonly known as:  PROVENTIL HFA;VENTOLIN HFA Inhale 2 puffs into the lungs every 6 (six) hours as needed for wheezing or shortness of breath.   escitalopram 20 MG tablet Commonly known as:  LEXAPRO Take 30 mg by mouth daily. Takes 1.5 tablet daily   ibuprofen 200 MG tablet Commonly known as:  ADVIL,MOTRIN Take 400 mg by mouth every 6 (six) hours as needed for mild pain. Notes to patient:  Avoid NSAIDs for 6-8 weeks after surgery   levothyroxine 25 MCG tablet Commonly known as:  SYNTHROID, LEVOTHROID Take 25 mcg by mouth daily before breakfast.   Melatonin 5 MG Caps Take 5 mg by mouth at bedtime.   multivitamin with minerals Tabs tablet Take 1 tablet by mouth daily.   MYDAYIS 37.5 MG Cp24 Generic drug:  Amphet-Dextroamphet 3-Bead ER Take 37.5 mg by mouth daily.   ranitidine 150 MG tablet Commonly known as:  ZANTAC Take 150 mg by mouth daily as needed for heartburn. Depends on heartburn if takes the 75 mg or 150 mg   ranitidine 75 MG tablet Commonly known as:  ZANTAC Take 75 mg by mouth daily as needed for heartburn. Depends on heartburn if takes 75 mg or 150 mg      Follow-up Information    [name], MD. Go  on 12/07/2016.   Specialty:  General Surgery Why:  at 930 am Contact information: [address] [place] [address] [phone]        [name], MD Follow up.   Specialty:  General Surgery Contact information: [address] [place] [address] [place] [address] [phone]            The results of significant diagnostics from this hospitalization (including imaging, microbiology, ancillary and laboratory) are listed below for reference.    Significant Diagnostic Studies: No results found.  Labs: Basic Metabolic Panel:  Recent Labs Lab 11/20/16 1548 11/21/16 0502  NA  --  139  K  --  3.7  CL  --  107  CO2  --  23  GLUCOSE  --  131*  BUN  --  8  CREATININE 0.87 0.60  CALCIUM  --  8.4*   Liver Function Tests:  Recent Labs Lab 11/21/16 0502  AST 21  ALT 19  ALKPHOS 68  BILITOT 0.7  PROT 6.8  ALBUMIN 3.5    CBC:  Recent Labs Lab 11/20/16 1548 11/21/16 0502  WBC 15.0* 11.0*  NEUTROABS  --  9.7*  HGB 11.5*  11.6* 10.5*  HCT 36.6  36.2 33.0*  MCV 75.3* 74.7*  PLT 316 302    CBG: No results for input(s): GLUCAP in the last 168 hours.  Active Problems:   Morbid obesity (HCC)   Time coordinating discharge: <37min

## 2016-11-21 NOTE — Progress Notes (Signed)
  Progress Note: Metabolic and Bariatric Surgery Service   Subjective: No nausea, tolerating water, ambulating, some tenderness and gas pain  Objective: Vital signs in last 24 hours: Temp:  [97.5 F (36.4 C)-98.5 F (36.9 C)] 98.3 F (36.8 C) (04/10 0610) Pulse Rate:  [51-81] 55 (04/10 0610) Resp:  [12-18] 16 (04/10 0610) BP: (92-133)/(58-80) 92/58 (04/10 0610) SpO2:  [96 %-100 %] 98 % (04/10 0144) Weight:  [115.5 kg (254 lb 9.6 oz)] 115.5 kg (254 lb 9.6 oz) (04/09 1151)    Intake/Output from previous day: 04/09 0701 - 04/10 0700 In: 2375 [P.O.:300; I.V.:2075] Out: 1325 [Urine:1300; Blood:25] Intake/Output this shift: No intake/output data recorded.  Lungs: CTAB  Cardiovascular: RRR  Abd: soft, ATTP, ND, incisions c/d/i  Extremities: no edema  Neuro: AOx4  Lab Results: CBC   Recent Labs  11/20/16 1548 11/21/16 0502  WBC 15.0* 11.0*  HGB 11.5*  11.6* 10.5*  HCT 36.6  36.2 33.0*  PLT 316 302   BMET  Recent Labs  11/20/16 1548 11/21/16 0502  NA  --  139  K  --  3.7  CL  --  107  CO2  --  23  GLUCOSE  --  131*  BUN  --  8  CREATININE 0.87 0.60  CALCIUM  --  8.4*   PT/INR No results for input(s): LABPROT, INR in the last 72 hours. ABG No results for input(s): PHART, HCO3 in the last 72 hours.  Invalid input(s): PCO2, PO2  Studies/Results:  Anti-infectives: Anti-infectives    Start     Dose/Rate Route Frequency Ordered Stop   11/20/16 1159  cefoTEtan in Dextrose 5% (CEFOTAN) IVPB 2 g     2 g Intravenous On call to O.R. 11/20/16 1159 11/20/16 1405      Medications: Scheduled Meds: . enoxaparin (LOVENOX) injection  30 mg Subcutaneous Q12H  . levothyroxine  25 mcg Oral QAC breakfast  . pantoprazole (PROTONIX) IV  40 mg Intravenous QHS  . [START ON 11/22/2016] protein supplement shake  2 oz Oral Q2H   Continuous Infusions: . sodium chloride 1,000 mL (11/21/16 0356)  . lactated ringers 75 mL/hr at 11/20/16 1250   PRN Meds:.oxyCODONE  **AND** acetaminophen, acetaminophen, albuterol, morphine injection, ondansetron (ZOFRAN) IV  Assessment/Plan: Patient Active Problem List   Diagnosis Date Noted  . Morbid obesity (HCC) 11/20/2016   s/p Procedure(s): LAPAROSCOPIC GASTRIC SLEEVE RESECTION WITH UPPER ENDOSCOPY 11/20/2016 -[facility]AS protocol  Disposition:  LOS: 1 day  The patient should be discharged from the hospital today  [name], MD [phone] [facility]

## 2016-11-21 NOTE — Progress Notes (Signed)
Assessment unchanged. Pt and mother verbalized understanding of dc instructions through teach back including when to call the doctor and follow up care. Bariatric-specific instructions given by Vonzell Schlatter with no further questions at this time. Discharged via wc to front entrance accompanied by NT and mother.

## 2016-11-27 ENCOUNTER — Inpatient Hospital Stay: Admit: 2016-11-27 | Payer: BLUE CROSS/BLUE SHIELD | Admitting: General Surgery

## 2016-11-27 SURGERY — GASTRECTOMY, SLEEVE, LAPAROSCOPIC
Anesthesia: General

## 2016-11-30 ENCOUNTER — Telehealth (HOSPITAL_COMMUNITY): Payer: Self-pay

## 2016-11-30 NOTE — Telephone Encounter (Addendum)
Phone message left for patient with return contact information provided.  Follow up discharge call.   Made discharge phone call to patientl. Asking the following questions.    1. Do you have someone to care for you now that you are home?   2. Are you having pain now that is not relieved by your pain medication?   3. Are you able to drink the recommended daily amount of fluids (48 ounces minimum/day) and protein (60-80 grams/day) as prescribed by the dietitian or nutritional counselor?   4. Are you taking the vitamins and minerals as prescribed?   5. Do you have the "on call" number to contact your surgeon if you have a problem or question?   6. Are your incisions free of redness, swelling or drainage? (If steri strips, address that these can fall off, shower as tolerated)  7. Have your bowels moved since your surgery?  If not, are you passing gas?   8. Are you up and walking 3-4 times per day?   9. Were you provided your discharge medications before your surgery or before you were discharged from the hospital and are you taking them without problem?

## 2016-12-05 ENCOUNTER — Encounter: Payer: BLUE CROSS/BLUE SHIELD | Attending: General Surgery | Admitting: Skilled Nursing Facility1

## 2016-12-05 DIAGNOSIS — Z6841 Body Mass Index (BMI) 40.0 and over, adult: Secondary | ICD-10-CM | POA: Insufficient documentation

## 2016-12-06 ENCOUNTER — Encounter: Payer: Self-pay | Admitting: Skilled Nursing Facility1

## 2016-12-06 NOTE — Progress Notes (Signed)
Bariatric Class:  Appt start time: 1530 end time:  1630.  2 Week Post-Operative Nutrition Class  Patient was seen on 12/05/2016 for Post-Operative Nutrition education at [facility].   Surgery date: 11/20/2016 Surgery type: Sleeve Gastrectomy  Start weight at [facility]:  Weight today: 263.8  TANITA  BODY COMP RESULTS  12/05/2016   BMI (kg/m^2) 40.8   Fat Mass (lbs) 122.2   Fat Free Mass (lbs) 122.8   Total Body Water (lbs) 90.2   The following the learning objectives were met by the patient during this course:  Identifies Phase 3A (Soft, High Proteins) Dietary Goals and will begin from 2 weeks post-operatively to 2 months post-operatively  Identifies appropriate sources of fluids and proteins   States protein recommendations and appropriate sources post-operatively  Identifies the need for appropriate texture modifications, mastication, and bite sizes when consuming solids  Identifies appropriate multivitamin and calcium sources post-operatively  Describes the need for physical activity post-operatively and will follow MD recommendations  States when to call healthcare provider regarding medication questions or post-operative complications  Handouts given during class include:  Phase 3A: Soft, High Protein Diet Handout  Follow-Up Plan: Patient will follow-up at [facility] in 6 weeks for 2 month post-op nutrition visit for diet advancement per MD.

## 2017-01-15 ENCOUNTER — Ambulatory Visit: Payer: BLUE CROSS/BLUE SHIELD | Admitting: Registered"

## 2017-01-15 NOTE — Addendum Note (Signed)
Addendum  created 01/15/17 1304 by Tasheem Elms, MD   Sign clinical note    

## 2017-01-16 ENCOUNTER — Encounter: Payer: BLUE CROSS/BLUE SHIELD | Attending: General Surgery | Admitting: Registered"

## 2017-01-16 DIAGNOSIS — E669 Obesity, unspecified: Secondary | ICD-10-CM

## 2017-01-16 DIAGNOSIS — Z6841 Body Mass Index (BMI) 40.0 and over, adult: Secondary | ICD-10-CM | POA: Diagnosis present

## 2017-01-16 NOTE — Progress Notes (Signed)
Follow-up visit: 8 Weeks Post-Operative Sleeve Surgery  Medical Nutrition Therapy:  Appt start time: 10:15 end time:  10:59.  Primary concerns today: Post-operative Bariatric Surgery Nutrition Management.  Non scale victories: none stated  Surgery date: 11/20/2016 Surgery type: Sleeve gastrectomy [facility] weight at [facility]: 263.5 lbs Weight today: 221.6 lbs Weight lost: 23.4 lbs from 12/06/2016 Total weight lost: 41.9 lbs Weight loss goal: none stated   TANITA  BODY COMP RESULTS  12/05/2016 01/16/2017   BMI (kg/m^2) 40.8 35.8   Fat Mass (lbs) 122.2 102.6   Fat Free Mass (lbs) 122.8 119.0   Total Body Water (lbs) 90.2 86.6    Pt states she has been vegetarian for about a year and has been having issues finding non-meat sources other than refried beans, cottage cheese, protein shakes, and  [employer] products. Pt states she struggles with taking Ca supplements at times due to schedule of thyroid medications and forgetting to pack them. Pt states she will start BELT program next week. Pt states she wants to add more carbohydrates to help with balancing exercise routine. Pt states she noticed a week ago current medication dosages may be causing anxiety and will reach out to doctor for adjustments. Pt states she experiences back pain at times which may be attached to her paranoia of some things she has read about weight loss and back pain.    Preferred Learning Style:   No preference indicated   Learning Readiness:   Ready  Change in progress  24-hr recall: B (AM): triple zero yogurt (15g) Snk (AM): baybel bell cheese (5g) L (PM): cottage cheese (12.5g) Snk (PM): cottage cheese (12.5g) D (PM): cottage cheese (12.5g) Snk (PM): 1/2 protein shake (15g)  Fluid intake: 64+ oz fluid Estimated total protein intake: 72.5g   Medications: See list Supplementation: Yes, sometimes forgets Ca  Using straws: no Drinking while eating: not usually Having you been chewing well:  no Chewing/swallowing difficulties: no Changes in vision: no Changes to mood/headaches: emotional lately, will see doctor about medication adjustment Hair loss/Changes to skin/Changes to nails: no Any difficulty focusing or concentrating: sometimes  Sweating: night sweats sometimes Dizziness/Lightheaded: no Palpitations: no  Carbonated beverages: no N/V/D/C/GAS: constipation sometimes Abdominal Pain: occasional Dumping syndrome: no   Recent physical activity:  Walking, stretching, swimming  Progress Towards Goal(s):  In progress.  Handouts given during visit include:  Bariatric snacks post surgery  Phase IIIB High Protein + NS vegetables   Nutritional Diagnosis:  NI-5.8.5 Inadeqate fiber intake As related to post bariatric surgery Phase IIIA diet.  As evidenced by diet recall of cheese and constipation.    Intervention:  Nutrition education and counseling.  Goals: - Readjust alarms on phone for Ca supplements. - Contacting doctors about necessary med adjustments to help with focus and mood. - Aim for consistent physical activity in between BELT days.  Teaching Method Utilized:  Visual Auditory  Barriers to learning/adherence to lifestyle change: none  Demonstrated degree of understanding via:  Teach Back   Monitoring/Evaluation:  Dietary intake, exercise, lap band fills, and body weight. Follow up in 3 months for 5  month post-op visit.

## 2017-01-16 NOTE — Patient Instructions (Addendum)
-   Readjust alarms on phone for Ca supplements.  - Contacting doctors about necessary med adjustments to help with focus and mood.  - Aim for consistent physical activity in between BELT days.

## 2017-04-18 ENCOUNTER — Other Ambulatory Visit: Payer: Self-pay | Admitting: General Surgery

## 2017-04-18 ENCOUNTER — Ambulatory Visit: Payer: BLUE CROSS/BLUE SHIELD | Admitting: Registered"

## 2017-04-18 DIAGNOSIS — E669 Obesity, unspecified: Secondary | ICD-10-CM

## 2017-04-18 DIAGNOSIS — R1011 Right upper quadrant pain: Secondary | ICD-10-CM

## 2017-04-25 ENCOUNTER — Ambulatory Visit
Admission: RE | Admit: 2017-04-25 | Discharge: 2017-04-25 | Disposition: A | Discharge status: Home / Self Care | Payer: BLUE CROSS/BLUE SHIELD | Source: Ambulatory Visit | Attending: General Surgery | Admitting: General Surgery

## 2017-04-25 DIAGNOSIS — R1011 Right upper quadrant pain: Secondary | ICD-10-CM

## 2017-04-25 DIAGNOSIS — E669 Obesity, unspecified: Secondary | ICD-10-CM

## 2017-07-04 ENCOUNTER — Telehealth: Payer: Self-pay | Admitting: Hematology

## 2017-07-04 NOTE — Telephone Encounter (Signed)
[facility] about the status of referral and was informed I would reach out to patient re appointment.  Spoke with patient re appointment appointment with Dr. Candise CheKale 07/12/17 at 11 am. Patient given date/time/location. Demographic/insurance information confirmed. Returned call to to [place] [facility] and spoke with Janett Billowita re the above date/time and contact with patient.

## 2017-07-10 NOTE — Progress Notes (Signed)
HEMATOLOGY/ONCOLOGY CONSULTATION NOTE  Date of Service: 07/12/2017  Patient Care Team: Shirlean MylarWebb, Carol, MD as PCP - General (Family Medicine)  CHIEF COMPLAINTS/PURPOSE OF CONSULTATION:  Iron deficiency anemia   HISTORY OF PRESENTING ILLNESS:   Cassandra Wood is a wonderful 41 y.o. female who has been referred to us by Dr Shirlean MylarWebb, Carol, MD for evaluation and management of iron deficiency anemia.   The patient reports that she underwent gastric sleeve surgery in April 2018 and became a vegetarian at the beginning of the year. Her surgery went well and she did not have have any issues with post-op care. No transfusion was used during this.   She does report that she has lost approximately 90lbs since this surgery. She has been taking bariatric multivitamins since then. She reports that her Hgb began to drop several years ago while she was giving blood to [facility] (she used to give approximately every 44mo). She has been on oral iron replacement, however, this did not improve her symptoms and she reports some non-compliance with this as well. Since her surgery she reports that she has struggled with alternating diarrhea and constipation. She has been having pica symptoms, mainly involving ice cravings.   She was first informed that she was IDA several months ago with Dr Hyman HopesWebb. At the time she was c/o orthostatic dizziness and mild fatigue, a CBC was performed which showed her Hgb to be 12.8 and her Ferr level was 4.8. She does have a h/o hypothyroidism and she reports that her TSH has remained stable in the last few months. She has been compliant with her Levothyroxine.     She has never previously had to undergo blood transfusions. Her menses have been regular and not heavy. She is not aware of any vitamin D or B12 deficiencies. She is not currently on acid suppressants.    She has not had any recent concerns with excess bleeding. In her daily life she reports that she is very stressed  between work and school, but this is being managed well by other members of her care team. She does report that she has experienced some issues with hair loss and restless leg symptoms throughout this time as well.    MEDICAL HISTORY:  Past Medical History:  Diagnosis Date  . Anxiety    Social  . Depression   . Headache    Ocassional  . Hypothyroidism     SURGICAL HISTORY: Past Surgical History:  Procedure Laterality Date  . LAPAROSCOPIC GASTRIC SLEEVE RESECTION N/A 11/20/2016   Procedure: LAPAROSCOPIC GASTRIC SLEEVE RESECTION WITH UPPER ENDOSCOPY;  Surgeon: De BlanchLuke Aaron Kinsinger, MD;  Location: [facility];  Service: General;  Laterality: N/A;  . TOOTH EXTRACTION     4 wisdom tooth extracted in 2006    SOCIAL HISTORY: Social History   Socioeconomic History  . Marital status: Single    Spouse name: Not on file  . Number of children: Not on file  . Years of education: Not on file  . Highest education level: Not on file  Social Needs  . Financial resource strain: Not on file  . Food insecurity - worry: Not on file  . Food insecurity - inability: Not on file  . Transportation needs - medical: Not on file  . Transportation needs - non-medical: Not on file  Occupational History  . Not on file  Tobacco Use  . Smoking status: Former Smoker    Last attempt to quit: 11/14/2008    Years since  quitting: 8.6  . Smokeless tobacco: Former NeurosurgeonUser    Types: Chew  Substance and Sexual Activity  . Alcohol use: No  . Drug use: Yes    Types: Marijuana    Comment: Daily, Stopped in 2016  . Sexual activity: Yes    Birth control/protection: Condom  Other Topics Concern  . Not on file  Social History Narrative  . Not on file    FAMILY HISTORY: No family history on file.  ALLERGIES:  has No Known Allergies.  MEDICATIONS:  Current Outpatient Medications  Medication Sig Dispense Refill  . acetaminophen (TYLENOL) 500 MG tablet Take 1,000 mg by mouth every 6 (six) hours as needed for  headache.    . albuterol (PROVENTIL HFA;VENTOLIN HFA) 108 (90 Base) MCG/ACT inhaler Inhale 2 puffs into the lungs every 6 (six) hours as needed for wheezing or shortness of breath.    . escitalopram (LEXAPRO) 20 MG tablet Take 30 mg by mouth daily. Takes 1.5 tablet daily    . levothyroxine (SYNTHROID, LEVOTHROID) 25 MCG tablet Take 25 mcg by mouth daily before breakfast.     . Multiple Vitamin (MULTIVITAMIN WITH MINERALS) TABS tablet Take 1 tablet by mouth daily.    Marland Kitchen. MYDAYIS 37.5 MG CP24 Take 37.5 mg by mouth daily.      No current facility-administered medications for this visit.     REVIEW OF SYSTEMS:   A 10+ POINT REVIEW OF SYSTEMS WAS OBTAINED including neurology, dermatology, psychiatry, cardiac, respiratory, lymph, extremities, GI, GU, Musculoskeletal, constitutional, breasts, reproductive, HEENT.  All pertinent positives are noted in the HPI.  All others are negative.  PHYSICAL EXAMINATION: ECOG PERFORMANCE STATUS: 1 - Symptomatic but completely ambulatory  . Vitals:   07/12/17 1103  BP: 112/62  Pulse: 82  Resp: 18  Temp: 98.6 F (37 C)  SpO2: 99%   Filed Weights   07/12/17 1103  Weight: 172 lb 3.2 oz (78.1 kg)   .Body mass index is 28.66 kg/m.  GENERAL:alert, in no acute distress and comfortable SKIN: no acute rashes, no significant lesions EYES: conjunctiva are pink and non-injected, sclera anicteric OROPHARYNX: MMM, no exudates, no oropharyngeal erythema or ulceration NECK: supple, no JVD LYMPH:  no palpable lymphadenopathy in the cervical, axillary or inguinal regions LUNGS: clear to auscultation b/l with normal respiratory effort HEART: regular rate & rhythm ABDOMEN:  normoactive bowel sounds , non tender, not distended. Extremity: no pedal edema PSYCH: alert & oriented x 3 with fluent speech NEURO: no focal motor/sensory deficits  LABORATORY DATA:  I have reviewed the data as listed  . CBC Latest Ref Rng & Units 07/12/2017 11/21/2016 11/20/2016  WBC 3.9 -  10.3 10e3/uL 9.2 11.0(H) 15.0(H)  Hemoglobin 11.6 - 15.9 g/dL 16.112.0 10.5(L) 11.5(L)  Hematocrit 34.8 - 46.6 % 37.2 33.0(L) 36.6  Platelets 145 - 400 10e3/uL 340 302 316    . CMP Latest Ref Rng & Units 07/12/2017 11/21/2016 11/20/2016  Glucose 70 - 140 mg/dl 70 096(E131(H) -  BUN 7.0 - 26.0 mg/dL 5.2(L) 8 -  Creatinine 0.6 - 1.1 mg/dL 0.7 4.540.60 0.980.87  Sodium 119136 - 145 mEq/L 140 139 -  Potassium 3.5 - 5.1 mEq/L 3.7 3.7 -  Chloride 101 - 111 mmol/L - 107 -  CO2 22 - 29 mEq/L 27 23 -  Calcium 8.4 - 10.4 mg/dL 9.3 1.4(N8.4(L) -  Total Protein 6.4 - 8.3 g/dL 7.1 6.8 -  Total Bilirubin 0.20 - 1.20 mg/dL 8.290.29 0.7 -  Alkaline Phos 40 - 150 U/L 80  68 -  AST 5 - 34 U/L 13 21 -  ALT 0 - 55 U/L 7 19 -   . Lab Results  Component Value Date   IRON 16 (L) 07/12/2017   TIBC 374 07/12/2017   IRONPCTSAT 4 (L) 07/12/2017   (Iron and TIBC)  Lab Results  Component Value Date   FERRITIN 5 (L) 07/12/2017   B12 --- 1211   RADIOGRAPHIC STUDIES: I have personally reviewed the radiological images as listed and agreed with the findings in the report. No results found.  ASSESSMENT & PLAN:  Cassandra Wood is a wonderful 41 y.o. female who presents to discuss the following:   1. Iron deficiency anemia  -Time will tell us if this is related to an absorption issue related to her recent surgery or if it is a chronic issue which was exacerbated by her recent surgery.  She has not had any issues with excessive menstrual bleeding or overt GI or GU blood loss.  2. Pica symptoms - ice cravign due to Iron deficiency Plan -we discussed potential etiologies of her iron deficiency and the fact that her gastric surgery could potentially affect iron absorption. -We did discuss treatment options for iron replacement -  oral vs IV iron therapy and she would prefer the IV option currently -given her pica symptoms and symptoms of significant fatigue and difficulty with maintaining oral iron therapy. -Once we replace her  levels we will have to monitor if supplementation therapy will stabilize her levels or if we will need to opt for further IV therapy or continued PO iron maintenance.  -We will schedule her for IV Injectafer 750mg  weekly x 2 doses in the near future - the risks vs benefits and potential side effects were discussed in detail with her.  -She is not aware of any medication-related allergies.  -Her B12 levels are adequate. -She was recommended to be mindful of continued bariatric multivitamin replacement to avoid triggering any other B vitamin deficiencies after iron replacement.  Labs today IV INjectafer weekly x 2 doses RTC with Dr Candise Che in 2 months with labs  All of the patients questions were answered with apparent satisfaction. The patient knows to call the clinic with any problems, questions or concerns.  I spent 35 minutes counseling the patient face to face. The total time spent in the appointment was 45 minutes and more than 50% was on counseling and direct patient cares.  Wyvonnia Lora MD MS AAHIVMS [facility] Hematology/Oncology Physician [facility]  (Office):       [phone] (Work cell):  [phone] (Fax):           [phone]  07/12/2017 12:26 PM  This document serves as a record of services personally performed by Wyvonnia Lora, MD. It was created on his behalf by Rosario Adie, a trained medical scribe. The creation of this record is based on the scribe's personal observations and the provider's statements to them.   .I have reviewed the above documentation for accuracy and completeness, and I agree with the above. Johney Maine MD MS

## 2017-07-12 ENCOUNTER — Telehealth: Payer: Self-pay

## 2017-07-12 ENCOUNTER — Encounter: Payer: Self-pay | Admitting: Hematology

## 2017-07-12 ENCOUNTER — Ambulatory Visit (HOSPITAL_BASED_OUTPATIENT_CLINIC_OR_DEPARTMENT_OTHER): Payer: BLUE CROSS/BLUE SHIELD | Admitting: Hematology

## 2017-07-12 ENCOUNTER — Ambulatory Visit (HOSPITAL_BASED_OUTPATIENT_CLINIC_OR_DEPARTMENT_OTHER): Payer: BLUE CROSS/BLUE SHIELD

## 2017-07-12 VITALS — BP 112/62 | HR 82 | Temp 98.6°F | Resp 18 | Ht 65.0 in | Wt 172.2 lb

## 2017-07-12 DIAGNOSIS — Z9884 Bariatric surgery status: Secondary | ICD-10-CM | POA: Diagnosis not present

## 2017-07-12 DIAGNOSIS — Z87891 Personal history of nicotine dependence: Secondary | ICD-10-CM | POA: Diagnosis not present

## 2017-07-12 DIAGNOSIS — D509 Iron deficiency anemia, unspecified: Secondary | ICD-10-CM

## 2017-07-12 LAB — COMPREHENSIVE METABOLIC PANEL
ALT: 7 U/L (ref 0–55)
AST: 13 U/L (ref 5–34)
Albumin: 3.6 g/dL (ref 3.5–5.0)
Alkaline Phosphatase: 80 U/L (ref 40–150)
Anion Gap: 9 mEq/L (ref 3–11)
BUN: 5.2 mg/dL — AB (ref 7.0–26.0)
CALCIUM: 9.3 mg/dL (ref 8.4–10.4)
CHLORIDE: 103 meq/L (ref 98–109)
CO2: 27 meq/L (ref 22–29)
Creatinine: 0.7 mg/dL (ref 0.6–1.1)
EGFR: >60 mL/min/{1.73_m2} (ref >60)
Glucose: 70 mg/dl (ref 70–140)
POTASSIUM: 3.7 meq/L (ref 3.5–5.1)
Sodium: 140 mEq/L (ref 136–145)
Total Bilirubin: 0.29 mg/dL (ref 0.20–1.20)
Total Protein: 7.1 g/dL (ref 6.4–8.3)

## 2017-07-12 LAB — IRON AND TIBC
%SAT: 4 % — AB (ref 21–57)
Iron: 16 ug/dL — ABNORMAL LOW (ref 41–142)
TIBC: 374 ug/dL (ref 236–444)
UIBC: 358 ug/dL (ref 120–384)

## 2017-07-12 LAB — CBC & DIFF AND RETIC
BASO%: 0.1 % (ref 0.0–2.0)
BASOS ABS: 0 10*3/uL (ref 0.0–0.1)
EOS ABS: 0 10*3/uL (ref 0.0–0.5)
EOS%: 0.2 % (ref 0.0–7.0)
HEMATOCRIT: 37.2 % (ref 34.8–46.6)
HEMOGLOBIN: 12 g/dL (ref 11.6–15.9)
Immature Retic Fract: 11.9 % — ABNORMAL HIGH (ref 1.60–10.00)
LYMPH#: 2.5 10*3/uL (ref 0.9–3.3)
LYMPH%: 26.7 % (ref 14.0–49.7)
MCH: 24.4 pg — ABNORMAL LOW (ref 25.1–34.0)
MCHC: 32.3 g/dL (ref 31.5–36.0)
MCV: 75.6 fL — ABNORMAL LOW (ref 79.5–101.0)
MONO#: 0.4 10*3/uL (ref 0.1–0.9)
MONO%: 4.4 % (ref 0.0–14.0)
NEUT%: 68.6 % (ref 38.4–76.8)
NEUTROS ABS: 6.3 10*3/uL (ref 1.5–6.5)
Platelets: 340 10*3/uL (ref 145–400)
RBC: 4.92 10*6/uL (ref 3.70–5.45)
RDW: 18 % — AB (ref 11.2–14.5)
RETIC %: 0.72 % (ref 0.70–2.10)
RETIC CT ABS: 35.42 10*3/uL (ref 33.70–90.70)
WBC: 9.2 10*3/uL (ref 3.9–10.3)

## 2017-07-12 LAB — FERRITIN: FERRITIN: 5 ng/mL — AB (ref 9–269)

## 2017-07-12 NOTE — Telephone Encounter (Signed)
Printed patient avs and calender for upcoming appointment. Per 11/29 los

## 2017-07-12 NOTE — Patient Instructions (Signed)
Thank you for choosing [facility] to provide your oncology and hematology care.  To afford each patient quality time with our providers, please arrive 30 minutes before your scheduled appointment time.  If you arrive late for your appointment, you may be asked to reschedule.  We strive to give you quality time with our providers, and arriving late affects you and other patients whose appointments are after yours.   If you are a no show for multiple scheduled visits, you may be dismissed from the clinic at the providers discretion.    Again, thank you for choosing [facility], our hope is that these requests will decrease the amount of time that you wait before being seen by our physicians.  ______________________________________________________________________  Should you have questions after your visit to the [facility], please contact our office at [phone] between the hours of 8:30 and 4:30 p.m.    Voicemails left after 4:30p.m will not be returned until the following business day.    For prescription refill requests, please have your pharmacy contact us directly.  Please also try to allow 48 hours for prescription requests.    Please contact the scheduling department for questions regarding scheduling.  For scheduling of procedures such as PET scans, CT scans, MRI, Ultrasound, etc please contact central scheduling at [phone].    Resources For Cancer Patients and Caregivers:   Oncolink.org:  A wonderful resource for patients and healthcare providers for information regarding your disease, ways to tract your treatment, what to expect, etc.     American Cancer Society:  800-227-2345  Can help patients locate various types of support and financial assistance  Cancer Care: 1-800-813-HOPE (4673) Provides financial assistance, online support groups, medication/co-pay assistance.    [place] DSS:  336-641-3447 Where to apply for food  stamps, Medicaid, and utility assistance  Medicare Rights Center: 800-333-4114 Helps people with Medicare understand their rights and benefits, navigate the Medicare system, and secure the quality healthcare they deserve  SCAT: 336-333-6589 [place] [employer]'s shared-ride transportation service for eligible riders who have a disability that prevents them from riding the fixed route bus.    For additional information on assistance programs please contact our social worker:   Grier Hock/Abigail Elmore:  [phone]            

## 2017-07-13 LAB — VITAMIN B12: Vitamin B12: 1211 pg/mL (ref 232–1245)

## 2017-07-19 ENCOUNTER — Ambulatory Visit (HOSPITAL_BASED_OUTPATIENT_CLINIC_OR_DEPARTMENT_OTHER): Payer: BLUE CROSS/BLUE SHIELD

## 2017-07-19 VITALS — BP 104/65 | HR 70 | Temp 98.4°F | Resp 18

## 2017-07-19 DIAGNOSIS — D509 Iron deficiency anemia, unspecified: Secondary | ICD-10-CM

## 2017-07-19 MED ORDER — SODIUM CHLORIDE 0.9 % IV SOLN
750.0000 mg | Freq: Once | INTRAVENOUS | Status: AC
  Administered 2017-07-19: 750 mg via INTRAVENOUS
  Filled 2017-07-19: qty 15

## 2017-07-19 NOTE — Patient Instructions (Signed)
Ferric carboxymaltose injection What is this medicine? FERRIC CARBOXYMALTOSE (ferr-ik car-box-ee-mol-toes) is an iron complex. Iron is used to make healthy red blood cells, which carry oxygen and nutrients throughout the body. This medicine is used to treat anemia in people with chronic kidney disease or people who cannot take iron by mouth. This medicine may be used for other purposes; ask your health care provider or pharmacist if you have questions. COMMON BRAND NAME(S): Injectafer What should I tell my health care provider before I take this medicine? They need to know if you have any of these conditions: -anemia not caused by low iron levels -high levels of iron in the blood -liver disease -an unusual or allergic reaction to iron, other medicines, foods, dyes, or preservatives -pregnant or trying to get pregnant -breast-feeding How should I use this medicine? This medicine is for infusion into a vein. It is given by a health care professional in a hospital or clinic setting. Talk to your pediatrician regarding the use of this medicine in children. Special care may be needed. Overdosage: If you think you have taken too much of this medicine contact a poison control center or emergency room at once. NOTE: This medicine is only for you. Do not share this medicine with others. What if I miss a dose? It is important not to miss your dose. Call your doctor or health care professional if you are unable to keep an appointment. What may interact with this medicine? Do not take this medicine with any of the following medications: -deferoxamine -dimercaprol -other iron products This medicine may also interact with the following medications: -chloramphenicol -deferasirox This list may not describe all possible interactions. Give your health care provider a list of all the medicines, herbs, non-prescription drugs, or dietary supplements you use. Also tell them if you smoke, drink alcohol, or use  illegal drugs. Some items may interact with your medicine. What should I watch for while using this medicine? Visit your doctor or health care professional regularly. Tell your doctor if your symptoms do not start to get better or if they get worse. You may need blood work done while you are taking this medicine. You may need to follow a special diet. Talk to your doctor. Foods that contain iron include: whole grains/cereals, dried fruits, beans, or peas, leafy green vegetables, and organ meats (liver, kidney). What side effects may I notice from receiving this medicine? Side effects that you should report to your doctor or health care professional as soon as possible: -allergic reactions like skin rash, itching or hives, swelling of the face, lips, or tongue -breathing problems -changes in blood pressure -feeling faint or lightheaded, falls -flushing, sweating, or hot feelings Side effects that usually do not require medical attention (report to your doctor or health care professional if they continue or are bothersome): -changes in taste -constipation -dizziness -headache -nausea -pain, redness, or irritation at site where injected -vomiting This list may not describe all possible side effects. Call your doctor for medical advice about side effects. You may report side effects to FDA at 1-800-FDA-1088. Where should I keep my medicine? This drug is given in a hospital or clinic and will not be stored at home. NOTE: This sheet is a summary. It may not cover all possible information. If you have questions about this medicine, talk to your doctor, pharmacist, or health care provider.  2018 Elsevier/Gold Standard (2015-09-02 11:20:47)  

## 2017-07-26 ENCOUNTER — Ambulatory Visit: Payer: BLUE CROSS/BLUE SHIELD

## 2017-07-31 ENCOUNTER — Ambulatory Visit (HOSPITAL_BASED_OUTPATIENT_CLINIC_OR_DEPARTMENT_OTHER): Payer: BLUE CROSS/BLUE SHIELD

## 2017-07-31 ENCOUNTER — Other Ambulatory Visit: Payer: Self-pay | Admitting: Medical

## 2017-07-31 ENCOUNTER — Ambulatory Visit: Payer: BLUE CROSS/BLUE SHIELD

## 2017-07-31 ENCOUNTER — Ambulatory Visit: Payer: BLUE CROSS/BLUE SHIELD | Admitting: Medical

## 2017-07-31 VITALS — BP 111/61 | HR 92 | Temp 99.0°F | Resp 17

## 2017-07-31 DIAGNOSIS — D509 Iron deficiency anemia, unspecified: Secondary | ICD-10-CM | POA: Diagnosis not present

## 2017-07-31 DIAGNOSIS — R55 Syncope and collapse: Secondary | ICD-10-CM

## 2017-07-31 MED ORDER — SODIUM CHLORIDE 0.9 % IV SOLN
750.0000 mg | Freq: Once | INTRAVENOUS | Status: AC
  Administered 2017-07-31: 750 mg via INTRAVENOUS
  Filled 2017-07-31: qty 15

## 2017-07-31 MED ORDER — SODIUM CHLORIDE 0.9 % IV SOLN
INTRAVENOUS | Status: DC
  Administered 2017-07-31: 17:00:00 via INTRAVENOUS

## 2017-07-31 NOTE — Patient Instructions (Signed)
Ferric carboxymaltose injection What is this medicine? FERRIC CARBOXYMALTOSE (ferr-ik car-box-ee-mol-toes) is an iron complex. Iron is used to make healthy red blood cells, which carry oxygen and nutrients throughout the body. This medicine is used to treat anemia in people with chronic kidney disease or people who cannot take iron by mouth. This medicine may be used for other purposes; ask your health care provider or pharmacist if you have questions. COMMON BRAND NAME(S): Injectafer What should I tell my health care provider before I take this medicine? They need to know if you have any of these conditions: -anemia not caused by low iron levels -high levels of iron in the blood -liver disease -an unusual or allergic reaction to iron, other medicines, foods, dyes, or preservatives -pregnant or trying to get pregnant -breast-feeding How should I use this medicine? This medicine is for infusion into a vein. It is given by a health care professional in a hospital or clinic setting. Talk to your pediatrician regarding the use of this medicine in children. Special care may be needed. Overdosage: If you think you have taken too much of this medicine contact a poison control center or emergency room at once. NOTE: This medicine is only for you. Do not share this medicine with others. What if I miss a dose? It is important not to miss your dose. Call your doctor or health care professional if you are unable to keep an appointment. What may interact with this medicine? Do not take this medicine with any of the following medications: -deferoxamine -dimercaprol -other iron products This medicine may also interact with the following medications: -chloramphenicol -deferasirox This list may not describe all possible interactions. Give your health care provider a list of all the medicines, herbs, non-prescription drugs, or dietary supplements you use. Also tell them if you smoke, drink alcohol, or use  illegal drugs. Some items may interact with your medicine. What should I watch for while using this medicine? Visit your doctor or health care professional regularly. Tell your doctor if your symptoms do not start to get better or if they get worse. You may need blood work done while you are taking this medicine. You may need to follow a special diet. Talk to your doctor. Foods that contain iron include: whole grains/cereals, dried fruits, beans, or peas, leafy green vegetables, and organ meats (liver, kidney). What side effects may I notice from receiving this medicine? Side effects that you should report to your doctor or health care professional as soon as possible: -allergic reactions like skin rash, itching or hives, swelling of the face, lips, or tongue -breathing problems -changes in blood pressure -feeling faint or lightheaded, falls -flushing, sweating, or hot feelings Side effects that usually do not require medical attention (report to your doctor or health care professional if they continue or are bothersome): -changes in taste -constipation -dizziness -headache -nausea -pain, redness, or irritation at site where injected -vomiting This list may not describe all possible side effects. Call your doctor for medical advice about side effects. You may report side effects to FDA at 1-800-FDA-1088. Where should I keep my medicine? This drug is given in a hospital or clinic and will not be stored at home. NOTE: This sheet is a summary. It may not cover all possible information. If you have questions about this medicine, talk to your doctor, pharmacist, or health care provider.  2018 Elsevier/Gold Standard (2015-09-02 11:20:47)  

## 2017-07-31 NOTE — Progress Notes (Signed)
Pt became unresponsive for about 1-3 mins, diaphoretic after attempting to insert an IV, Zenaida NieceVan PA was paged. Pt VSS, Van at bedside assessing the pt. Pt alert and response appropriately. Will continue to monitor.    Pt waited for 30 minute post iron infusion, pt alert and oriented, Vital signs stable upon discharge.

## 2017-08-01 NOTE — Progress Notes (Signed)
[facility] Progress Note   Cassandra Wood [id number] [birth year] 41 y.o.  Cassandra Wood is managed by Dr. Wyvonnia LoraGautam Kale  Actively treated with chemotherapy: no  Current Therapy: Feraheme    Last Treated: 12 /01/2017  Assessment: Plan:    Vaso vagal episode  Cassandra Wood was seen in the infusion room for a suspected vasovagal response. She was preparing to receive Feraheme at the time. She had an unsuccessful IV start and was preparing to have a second attempt for an IV placement when she postured, closed her fists, tilted her head back, and rolled her eyes back.  This lasted for several seconds.  This resolved with the patient responding to commands.  She was alert and oriented.  Her second IV was placed with out incident.  She then was able to begin IV iron without any further issues of concern.  Please see After Visit Summary for patient specific instructions.  Future Appointments  Date Time Provider Department Center  09/06/2017  1:45 PM [facility] 5 [facility] None  09/06/2017  2:20 PM Kale, Corene CorneaGautam Kishore, [facility]    No orders of the defined types were placed in this encounter.      Subjective:   Patient ID:  Cassandra Wood is a 41 y.o. (DOB [birth year]) female.  Chief Complaint: No chief complaint on file.   HPI Cassandra Wood was seen in the infusion room for a suspected reaction to chemotherapy.   Medications: I have reviewed the patient's current medications.  Allergies: No Known Allergies  Past Medical History:  Diagnosis Date  . Anxiety    Social  . Depression   . Headache    Ocassional  . Hypothyroidism     Past Surgical History:  Procedure Laterality Date  . LAPAROSCOPIC GASTRIC SLEEVE RESECTION N/A 11/20/2016   Procedure: LAPAROSCOPIC GASTRIC SLEEVE RESECTION WITH UPPER ENDOSCOPY;  Surgeon: De BlanchLuke Aaron Kinsinger, MD;  Location: [facility];  Service: General;  Laterality: N/A;  . TOOTH EXTRACTION      4 wisdom tooth extracted in 2006    No family history on file.  Social History   Socioeconomic History  . Marital status: Single    Spouse name: Not on file  . Number of children: Not on file  . Years of education: Not on file  . Highest education level: Not on file  Social Needs  . Financial resource strain: Not on file  . Food insecurity - worry: Not on file  . Food insecurity - inability: Not on file  . Transportation needs - medical: Not on file  . Transportation needs - non-medical: Not on file  Occupational History  . Not on file  Tobacco Use  . Smoking status: Former Smoker    Last attempt to quit: 11/14/2008    Years since quitting: 8.7  . Smokeless tobacco: Former NeurosurgeonUser    Types: Chew  Substance and Sexual Activity  . Alcohol use: No  . Drug use: Yes    Types: Marijuana    Comment: Daily, Stopped in 2016  . Sexual activity: Yes    Birth control/protection: Condom  Other Topics Concern  . Not on file  Social History Narrative  . Not on file    Past Medical History, Surgical history, Social history, and Family history were reviewed and updated as appropriate.   Please see review of systems for further details on the patient's review from today.   Review of Systems:  Review of Systems  Eyes: Negative for visual  disturbance.  Respiratory: Negative for chest tightness and shortness of breath.   Cardiovascular: Negative for chest pain.  Neurological:       Postured, tilted head back, rolled Eisbach, and clinched fists for several seconds.    Objective:   Physical Exam:  There were no vitals taken for this visit. ECOG: 0  Physical Exam  Constitutional: She is oriented to person, place, and time. No distress.  HENT:  Head: Normocephalic and atraumatic.  Neurological: She is alert and oriented to person, place, and time.  Skin: Skin is warm. She is not diaphoretic.    Lab Review:     Component Value Date/Time   NA 140 07/12/2017 1242   K 3.7  07/12/2017 1242   CL 107 11/21/2016 0502   CO2 27 07/12/2017 1242   GLUCOSE 70 07/12/2017 1242   BUN 5.2 (L) 07/12/2017 1242   CREATININE 0.7 07/12/2017 1242   CALCIUM 9.3 07/12/2017 1242   PROT 7.1 07/12/2017 1242   ALBUMIN 3.6 07/12/2017 1242   AST 13 07/12/2017 1242   ALT 7 07/12/2017 1242   ALKPHOS 80 07/12/2017 1242   BILITOT 0.29 07/12/2017 1242   GFRNONAA >60 11/21/2016 0502   GFRAA >60 11/21/2016 0502       Component Value Date/Time   WBC 9.2 07/12/2017 1242   WBC 11.0 (H) 11/21/2016 0502   RBC 4.92 07/12/2017 1242   RBC 4.42 11/21/2016 0502   HGB 12.0 07/12/2017 1242   HCT 37.2 07/12/2017 1242   PLT 340 07/12/2017 1242   MCV 75.6 (L) 07/12/2017 1242   MCH 24.4 (L) 07/12/2017 1242   MCH 23.8 (L) 11/21/2016 0502   MCHC 32.3 07/12/2017 1242   MCHC 31.8 11/21/2016 0502   RDW 18.0 (H) 07/12/2017 1242   LYMPHSABS 2.5 07/12/2017 1242   MONOABS 0.4 07/12/2017 1242   EOSABS 0.0 07/12/2017 1242   BASOSABS 0.0 07/12/2017 1242   -------------------------------  Imaging from last 24 hours (if applicable):  Radiology interpretation: No results found.

## 2017-09-05 NOTE — Progress Notes (Signed)
HEMATOLOGY/ONCOLOGY CONSULTATION NOTE  Date of Service: 09/06/2017  Patient Care Team: Shirlean Mylar, MD as PCP - General (Family Medicine)  CHIEF COMPLAINTS/PURPOSE OF CONSULTATION:  Iron deficiency anemia   HISTORY OF PRESENTING ILLNESS:   Cassandra Wood is a wonderful 42 y.o. female who has been referred to [place] by Dr Shirlean Mylar, MD for evaluation and management of iron deficiency anemia.   The patient reports that she underwent gastric sleeve surgery in April 2018 and became a vegetarian at the beginning of the year. Her surgery went well and she did not have have any issues with post-op care. No transfusion was used during this.   She does report that she has lost approximately 90lbs since this surgery. She has been taking bariatric multivitamins since then. She reports that her Hgb began to drop several years ago while she was giving blood to [employer] (she used to give approximately every 62mo). She has been on oral iron replacement, however, this did not improve her symptoms and she reports some non-compliance with this as well. Since her surgery she reports that she has struggled with alternating diarrhea and constipation. She has been having pica symptoms, mainly involving ice cravings.   She was first informed that she was IDA several months ago with Dr Hyman Hopes. At the time she was c/o orthostatic dizziness and mild fatigue, a CBC was performed which showed her Hgb to be 12.8 and her Ferr level was 4.8. She does have a h/o hypothyroidism and she reports that her TSH has remained stable in the last few months. She has been compliant with her Levothyroxine.     She has never previously had to undergo blood transfusions. Her menses have been regular and not heavy. She is not aware of any vitamin D or B12 deficiencies. She is not currently on acid suppressants.    She has not had any recent concerns with excess bleeding. In her daily life she reports that she is very stressed  between work and school, but this is being managed well by other members of her care team. She does report that she has experienced some issues with hair loss and restless leg symptoms throughout this time as well.    Interval History:  Cassandra Wood returns to clinic today for a follow up on her iron deficiency anemia. Her last visit with [place] was on 07/12/17 The pt reports that she is doing well overall. She notes increased energy, and no issues taking the IV iron. Today 09/06/17 her Hgb has risen to 15.1.  Ferritin levels have improved from 5 to 182.  She notes that she's been going through some difficult things, that has resulted in decreased appetite. She's lost weight and is following up with her bariatric team.  She has not taken her Vitamin B12 but notes wanting to begin that again.  On review of systems, pt reports infrequent eating, regular periods of normal weight,  and denies any other symptoms.   MEDICAL HISTORY:  Past Medical History:  Diagnosis Date  . Anxiety    Social  . Depression   . Headache    Ocassional  . Hypothyroidism     SURGICAL HISTORY: Past Surgical History:  Procedure Laterality Date  . LAPAROSCOPIC GASTRIC SLEEVE RESECTION N/A 11/20/2016   Procedure: LAPAROSCOPIC GASTRIC SLEEVE RESECTION WITH UPPER ENDOSCOPY;  Surgeon: De Blanch Kinsinger, MD;  Location: [facility];  Service: General;  Laterality: N/A;  . TOOTH EXTRACTION     4 wisdom tooth  extracted in 2006    SOCIAL HISTORY: Social History   Socioeconomic History  . Marital status: Single    Spouse name: Not on file  . Number of children: Not on file  . Years of education: Not on file  . Highest education level: Not on file  Social Needs  . Financial resource strain: Not on file  . Food insecurity - worry: Not on file  . Food insecurity - inability: Not on file  . Transportation needs - medical: Not on file  . Transportation needs - non-medical: Not on file  Occupational History  . Not on  file  Tobacco Use  . Smoking status: Former Smoker    Last attempt to quit: 11/14/2008    Years since quitting: 8.8  . Smokeless tobacco: Former NeurosurgeonUser    Types: Chew  Substance and Sexual Activity  . Alcohol use: No  . Drug use: Yes    Types: Marijuana    Comment: Daily, Stopped in 2016  . Sexual activity: Yes    Birth control/protection: Condom  Other Topics Concern  . Not on file  Social History Narrative  . Not on file    FAMILY HISTORY: No family history on file.  ALLERGIES:  has No Known Allergies.  MEDICATIONS:  Current Outpatient Medications  Medication Sig Dispense Refill  . acetaminophen (TYLENOL) 500 MG tablet Take 1,000 mg by mouth every 6 (six) hours as needed for headache.    . albuterol (PROVENTIL HFA;VENTOLIN HFA) 108 (90 Base) MCG/ACT inhaler Inhale 2 puffs into the lungs every 6 (six) hours as needed for wheezing or shortness of breath.    . amphetamine-dextroamphetamine (ADDERALL XR) 20 MG 24 hr capsule Take 20 mg by mouth 2 (two) times daily.  0  . amphetamine-dextroamphetamine (ADDERALL) 20 MG tablet Take 20 mg by mouth daily as needed.  0  . ARIPiprazole (ABILIFY) 5 MG tablet Take 5 mg by mouth daily.    Marland Kitchen. escitalopram (LEXAPRO) 20 MG tablet Take 30 mg by mouth daily. Takes 1.5 tablet daily    . levothyroxine (SYNTHROID, LEVOTHROID) 25 MCG tablet Take 25 mcg by mouth daily before breakfast.     . Multiple Vitamin (MULTIVITAMIN WITH MINERALS) TABS tablet Take 1 tablet by mouth daily.     No current facility-administered medications for this visit.     REVIEW OF SYSTEMS:   A 10+ POINT REVIEW OF SYSTEMS WAS OBTAINED including neurology, dermatology, psychiatry, cardiac, respiratory, lymph, extremities, GI, GU, Musculoskeletal, constitutional, breasts, reproductive, HEENT.  All pertinent positives are noted in the HPI.  All others are negative.  PHYSICAL EXAMINATION: ECOG PERFORMANCE STATUS: 1 - Symptomatic but completely ambulatory  . Vitals:   09/06/17  1416  BP: 112/72  Pulse: 84  Resp: 16  Temp: 99.1 F (37.3 C)  SpO2: 100%   Filed Weights   09/06/17 1416  Weight: 146 lb 1.6 oz (66.3 kg)   .Body mass index is 24.31 kg/m.  GENERAL:alert, in no acute distress and comfortable SKIN: no acute rashes, no significant lesions EYES: conjunctiva are pink and non-injected, sclera anicteric OROPHARYNX: MMM, no exudates, no oropharyngeal erythema or ulceration NECK: supple, no JVD LYMPH:  no palpable lymphadenopathy in the cervical, axillary or inguinal regions LUNGS: clear to auscultation b/l with normal respiratory effort HEART: regular rate & rhythm ABDOMEN:  normoactive bowel sounds , non tender, not distended. Extremity: no pedal edema PSYCH: alert & oriented x 3 with fluent speech NEURO: no focal motor/sensory deficits  LABORATORY DATA:  I  have reviewed the data as listed  . CBC Latest Ref Rng & Units 09/06/2017 07/12/2017 11/21/2016  WBC 3.9 - 10.3 K/uL 6.8 9.2 11.0(H)  Hemoglobin 11.6 - 15.9 g/dL - 40.9 10.5(L)  Hematocrit 34.8 - 46.6 % 43.9 37.2 33.0(L)  Platelets 145 - 400 K/uL 201 340 302    . CMP Latest Ref Rng & Units 07/12/2017 11/21/2016 11/20/2016  Glucose 70 - 140 mg/dl 70 811(B) -  BUN 7.0 - 26.0 mg/dL 5.2(L) 8 -  Creatinine 0.6 - 1.1 mg/dL 0.7 1.47 8.29  Sodium 562 - 145 mEq/L 140 139 -  Potassium 3.5 - 5.1 mEq/L 3.7 3.7 -  Chloride 101 - 111 mmol/L - 107 -  CO2 22 - 29 mEq/L 27 23 -  Calcium 8.4 - 10.4 mg/dL 9.3 1.3(Y) -  Total Protein 6.4 - 8.3 g/dL 7.1 6.8 -  Total Bilirubin 0.20 - 1.20 mg/dL 8.65 0.7 -  Alkaline Phos 40 - 150 U/L 80 68 -  AST 5 - 34 U/L 13 21 -  ALT 0 - 55 U/L 7 19 -   . Lab Results  Component Value Date   IRON 16 (L) 07/12/2017   TIBC 374 07/12/2017   IRONPCTSAT 4 (L) 07/12/2017   (Iron and TIBC)  Lab Results  Component Value Date   FERRITIN 5 (L) 07/12/2017   B12 --- 1211  . Lab Results  Component Value Date   IRON 51 09/06/2017   TIBC 198 (L) 09/06/2017    IRONPCTSAT 26 09/06/2017   (Iron and TIBC)  Lab Results  Component Value Date   FERRITIN 182 09/06/2017     RADIOGRAPHIC STUDIES: I have personally reviewed the radiological images as listed and agreed with the findings in the report. No results found.  ASSESSMENT & PLAN:  Cassandra Wood is a wonderful 42 y.o. female who presents to discuss the following:   1. Iron deficiency anemia  Patient notes she tolerated the IV INjectafer well with no issues and feels much better. hgb improved to 15.2. With ferritin now up from 5 to 182. With iron saturation of 26% Plan -patient feeling much better with resolution of fatigue and Pica symptoms. -no indication for additional IV iron at this time. -Time will tell us if this is related to an absorption issue related to her recent surgery or if it is a chronic issue which was exacerbated by her recent surgery. -She has no reported issues with excessive menstrual bleeding or overt GI or GU blood loss.  -we discussed potential etiologies of her iron deficiency and the fact that her gastric surgery could potentially affect iron absorption. -We will continue to watch iron over the next 6 months to a year and if it does remain stable we may be able to begin seeing her prn.  2. Pica symptoms - ice craving due to Iron deficiency - resolved. PLAN -Encouraged the pt to continue taking her Vitamin B12 -She was recommended to be mindful of continued bariatric multivitamin replacement to avoid triggering any other B vitamin deficiencies after iron replacement.  RTC with Dr Candise Che in 6 months with labs  All of the patients questions were answered with apparent satisfaction. The patient knows to call the clinic with any problems, questions or concerns.  I spent 15 minutes counseling the patient face to face. The total time spent in the appointment was 15 minutes and more than 50% was on counseling and direct patient cares.  Wyvonnia Lora MD MS AAHIVMS [facility]  [facility] Hematology/Oncology  [facility]  (Office):       [phone] (Work cell):  [phone] (Fax):           [phone]  This document serves as a record of services personally performed by Wyvonnia Lora, MD. It was created on his behalf by Marcelline Mates, a trained medical scribe. The creation of this record is based on the scribe's personal observations and the provider's statements to them.   .I have reviewed the above documentation for accuracy and completeness, and I agree with the above. Johney Maine MD MS

## 2017-09-06 ENCOUNTER — Inpatient Hospital Stay: Payer: BLUE CROSS/BLUE SHIELD

## 2017-09-06 ENCOUNTER — Encounter: Payer: Self-pay | Admitting: Hematology

## 2017-09-06 ENCOUNTER — Inpatient Hospital Stay: Payer: BLUE CROSS/BLUE SHIELD | Attending: Hematology | Admitting: Hematology

## 2017-09-06 ENCOUNTER — Telehealth: Payer: Self-pay | Admitting: Hematology

## 2017-09-06 VITALS — BP 112/72 | HR 84 | Temp 99.1°F | Resp 16 | Wt 146.1 lb

## 2017-09-06 DIAGNOSIS — D509 Iron deficiency anemia, unspecified: Secondary | ICD-10-CM

## 2017-09-06 DIAGNOSIS — R630 Anorexia: Secondary | ICD-10-CM

## 2017-09-06 DIAGNOSIS — Z87891 Personal history of nicotine dependence: Secondary | ICD-10-CM | POA: Diagnosis not present

## 2017-09-06 LAB — IRON AND TIBC
Iron: 51 ug/dL (ref 41–142)
SATURATION RATIOS: 26 % (ref 21–57)
TIBC: 198 ug/dL — AB (ref 236–444)
UIBC: 147 ug/dL

## 2017-09-06 LAB — CBC WITH DIFFERENTIAL (CANCER CENTER ONLY)
BASOS ABS: 0 10*3/uL (ref 0.0–0.1)
Basophils Relative: 0 %
Eosinophils Absolute: 0 10*3/uL (ref 0.0–0.5)
Eosinophils Relative: 1 %
HEMATOCRIT: 43.9 % (ref 34.8–46.6)
Hemoglobin: 15.1 g/dL (ref 11.6–15.9)
LYMPHS PCT: 28 %
Lymphs Abs: 1.9 10*3/uL (ref 0.9–3.3)
MCH: 28.4 pg (ref 25.1–34.0)
MCHC: 34.4 g/dL (ref 31.5–36.0)
MCV: 82.7 fL (ref 79.5–101.0)
Monocytes Absolute: 0.4 10*3/uL (ref 0.1–0.9)
Monocytes Relative: 5 %
NEUTROS ABS: 4.5 10*3/uL (ref 1.5–6.5)
Neutrophils Relative %: 66 %
Platelet Count: 201 10*3/uL (ref 145–400)
RBC: 5.31 MIL/uL (ref 3.70–5.45)
RDW: 21.6 % — ABNORMAL HIGH (ref 11.2–16.1)
WBC: 6.8 10*3/uL (ref 3.9–10.3)

## 2017-09-06 LAB — RETICULOCYTES
RBC.: 5.31 MIL/uL (ref 3.70–5.45)
RETIC COUNT ABSOLUTE: 42.5 10*3/uL (ref 33.7–90.7)
Retic Ct Pct: 0.8 % (ref 0.7–2.1)

## 2017-09-06 LAB — FERRITIN: Ferritin: 182 ng/mL (ref 9–269)

## 2017-09-06 NOTE — Patient Instructions (Signed)
Thank you for choosing [facility] to provide your oncology and hematology care.  To afford each patient quality time with our providers, please arrive 30 minutes before your scheduled appointment time.  If you arrive late for your appointment, you may be asked to reschedule.  We strive to give you quality time with our providers, and arriving late affects you and other patients whose appointments are after yours.   If you are a no show for multiple scheduled visits, you may be dismissed from the clinic at the providers discretion.    Again, thank you for choosing [facility], our hope is that these requests will decrease the amount of time that you wait before being seen by our physicians.  ______________________________________________________________________  Should you have questions after your visit to the [facility], please contact our office at [phone] between the hours of 8:30 and 4:30 p.m.    Voicemails left after 4:30p.m will not be returned until the following business day.    For prescription refill requests, please have your pharmacy contact us directly.  Please also try to allow 48 hours for prescription requests.    Please contact the scheduling department for questions regarding scheduling.  For scheduling of procedures such as PET scans, CT scans, MRI, Ultrasound, etc please contact central scheduling at [phone].    Resources For Cancer Patients and Caregivers:   Oncolink.org:  A wonderful resource for patients and healthcare providers for information regarding your disease, ways to tract your treatment, what to expect, etc.     American Cancer Society:  800-227-2345  Can help patients locate various types of support and financial assistance  Cancer Care: 1-800-813-HOPE (4673) Provides financial assistance, online support groups, medication/co-pay assistance.    [place] DSS:  336-641-3447 Where to apply for food  stamps, Medicaid, and utility assistance  Medicare Rights Center: 800-333-4114 Helps people with Medicare understand their rights and benefits, navigate the Medicare system, and secure the quality healthcare they deserve  SCAT: 336-333-6589 [employer]'s shared-ride transportation service for eligible riders who have a disability that prevents them from riding the fixed route bus.    For additional information on assistance programs please contact our social worker:   Grier Hock/Abigail Elmore:  [phone]            

## 2017-09-06 NOTE — Telephone Encounter (Signed)
Gave avs and calendar for July ° °

## 2018-03-06 ENCOUNTER — Other Ambulatory Visit: Payer: BLUE CROSS/BLUE SHIELD

## 2018-03-06 ENCOUNTER — Ambulatory Visit: Payer: BLUE CROSS/BLUE SHIELD | Admitting: Hematology

## 2019-02-23 IMAGING — US US ABDOMEN LIMITED
1 series · 14 of 25 positions shown · non-contrast
Comparison: None.

CLINICAL DATA: Right upper quadrant and back pain. Tenderness with
nausea. Prior gastric sleeve procedure.

EXAM:
ULTRASOUND ABDOMEN LIMITED RIGHT UPPER QUADRANT

[Series 1: us abdomen limited · 0.23mm/px · 14 of 46 slices shown]
[im 1/46]
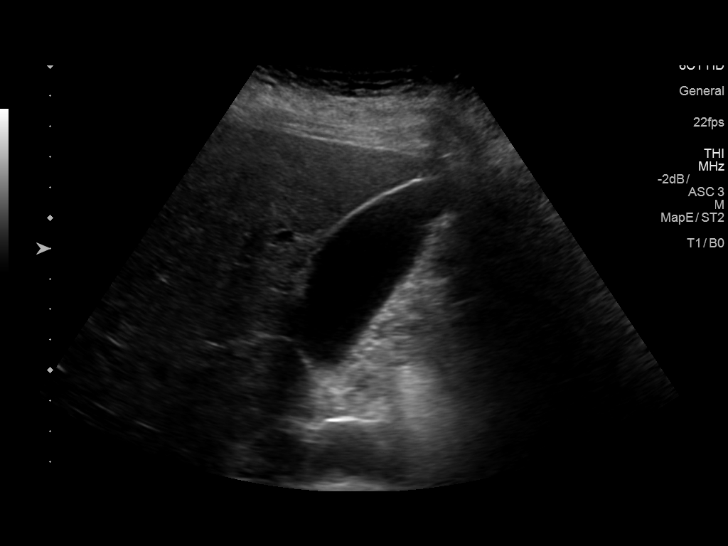
[im 4/46]
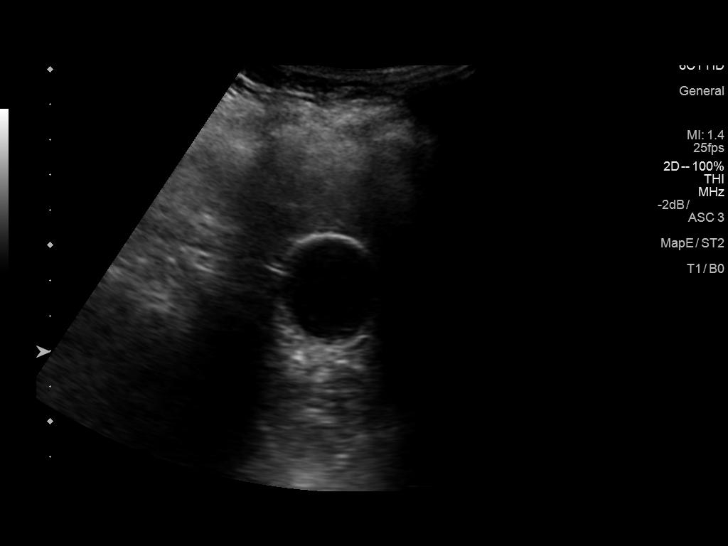
[im 8/46]
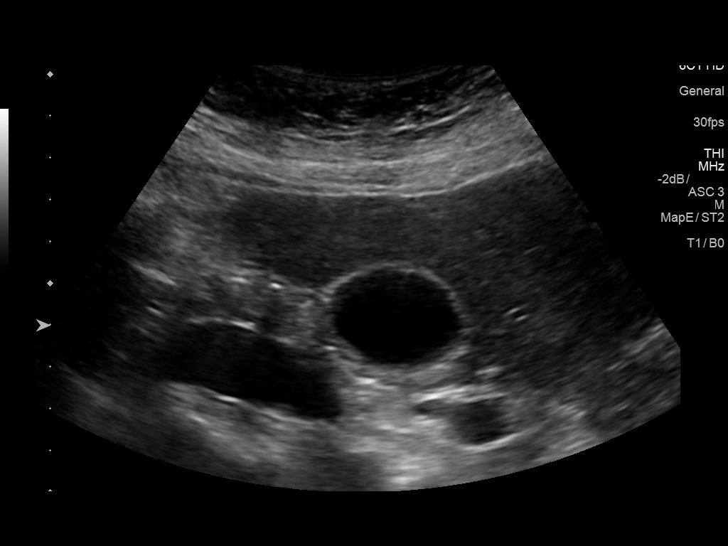
[im 12/46]
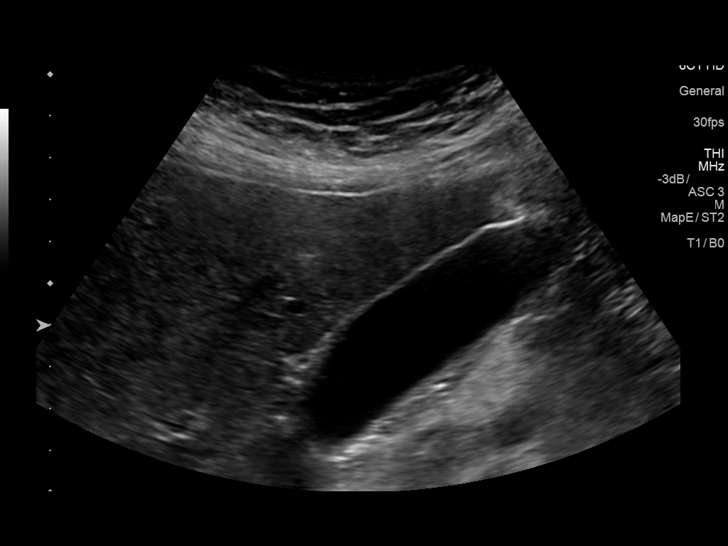
[im 16/46]
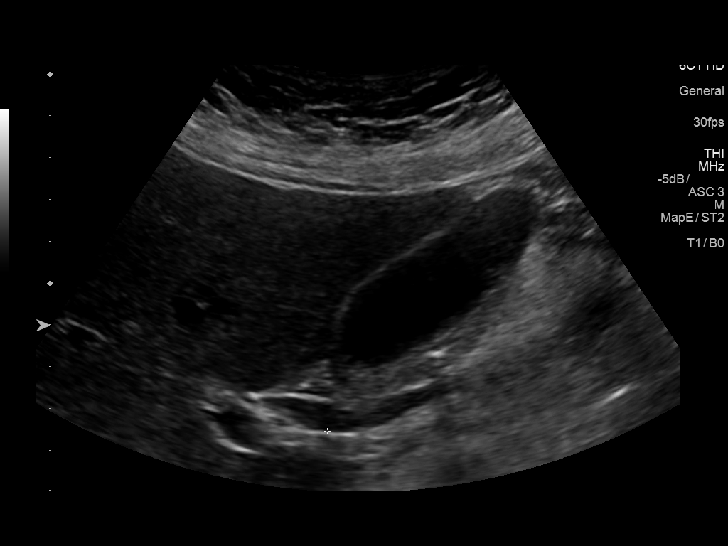
[im 17/46]
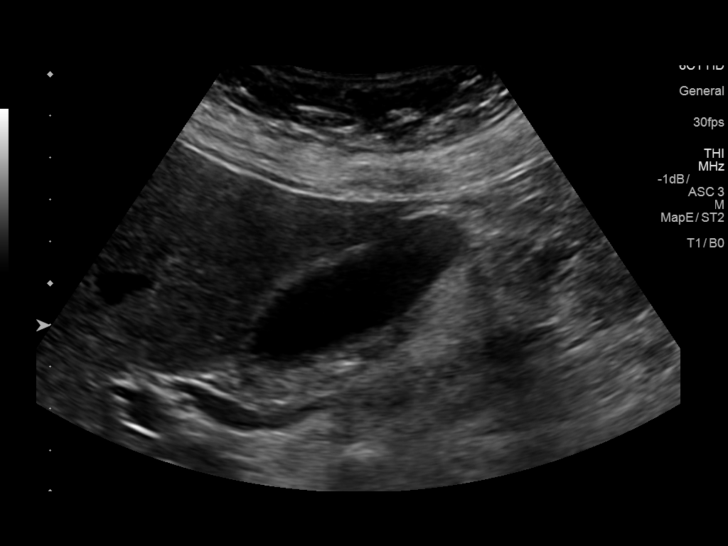
[im 21/46]
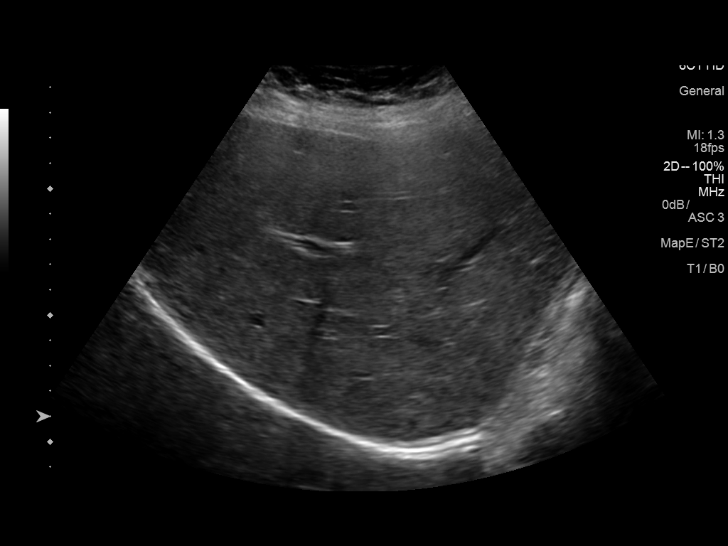
[im 25/46]
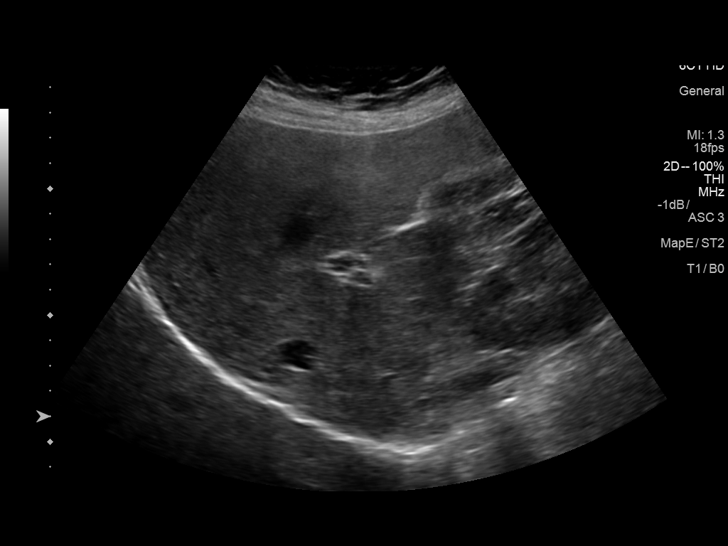
[im 29/46]
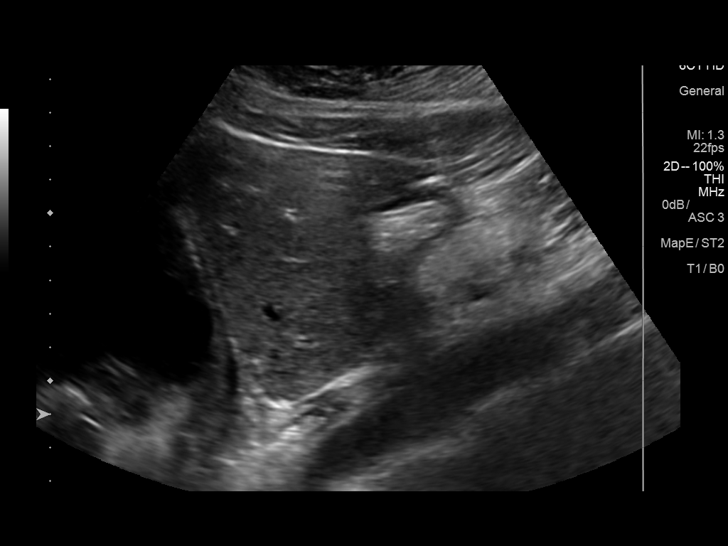
[im 31/46]
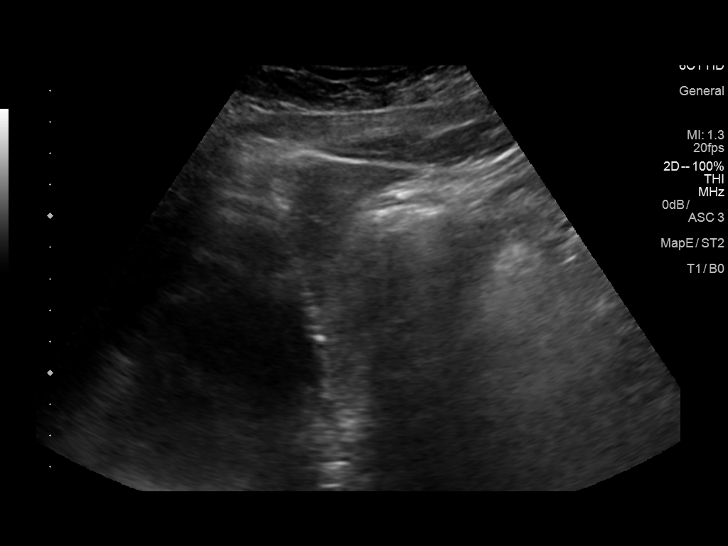
[im 34/46]
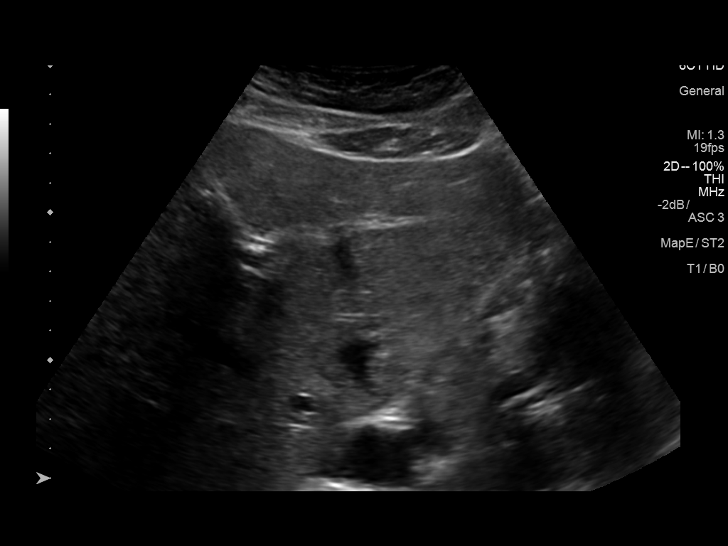
[im 38/46]
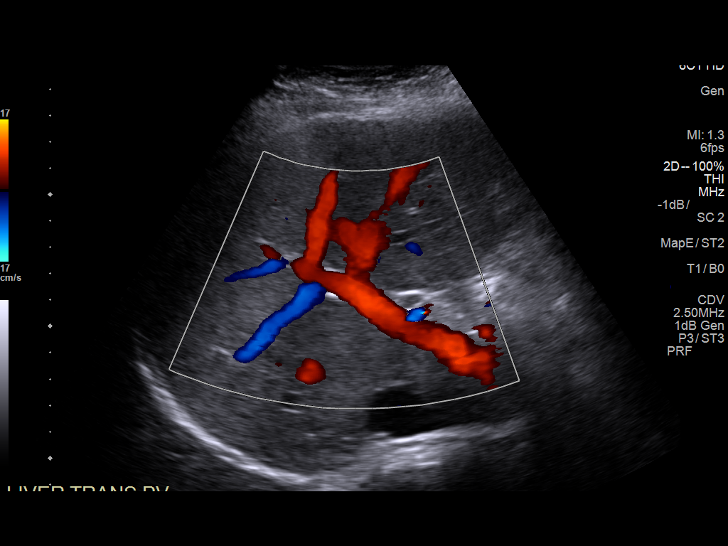
[im 42/46]
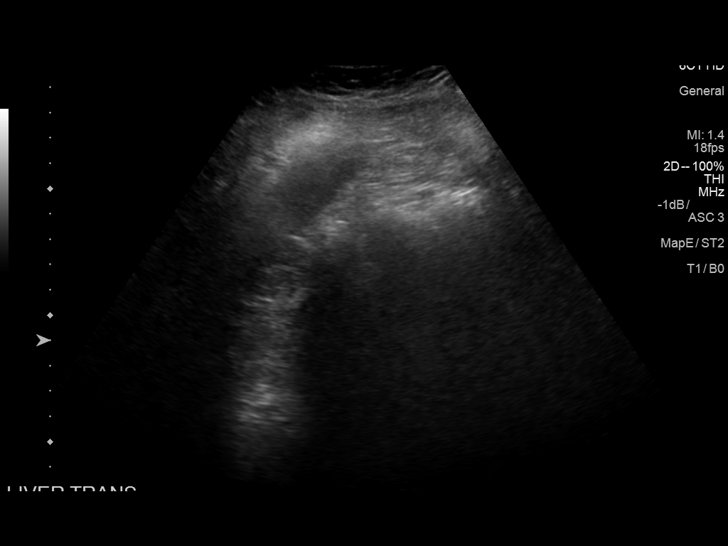
[im 46/46]
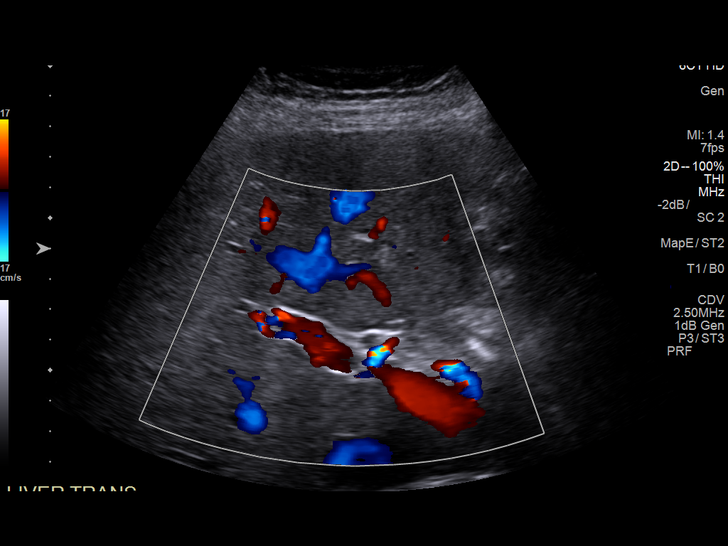

[14 of 25 positions shown; findings below may reference images not displayed]

FINDINGS: Gallbladder:

No gallstones or wall thickening visualized. No sonographic Murphy
sign noted by sonographer.

Common bile duct:

Diameter: Normal, 4 mm.

Liver:

8 mm central (likely right liver lobe) focus of hyperechogenicity,
including on image 47. Portal vein is patent on color Doppler
imaging with normal direction of blood flow towards the liver.
IMPRESSION: 1.  No acute process or explanation for right upper quadrant pain.
2. Central hepatic hyperechoic lesion is most likely a hemangioma.
Consider ultrasound follow-up at 6 months to confirm size stability.

## 2020-06-18 ENCOUNTER — Encounter (HOSPITAL_COMMUNITY): Payer: Self-pay

## 2021-06-16 ENCOUNTER — Encounter (HOSPITAL_COMMUNITY): Payer: Self-pay | Admitting: *Deleted

## 2022-06-14 ENCOUNTER — Encounter (HOSPITAL_COMMUNITY): Payer: Self-pay | Admitting: *Deleted

## 2023-06-07 ENCOUNTER — Encounter (HOSPITAL_COMMUNITY): Payer: Self-pay | Admitting: *Deleted
